# Patient Record
Sex: Female | Born: 1960 | Race: Asian | Hispanic: No | Marital: Married | Smoking: Former smoker
Health system: Southern US, Community
[De-identification: ages and names within clinical notes are randomized; demographics above are authoritative.]

## PROBLEM LIST (undated history)

## (undated) ENCOUNTER — Emergency Department (HOSPITAL_COMMUNITY)

## (undated) DIAGNOSIS — M7918 Myalgia, other site: Secondary | ICD-10-CM

## (undated) DIAGNOSIS — T8484XA Pain due to internal orthopedic prosthetic devices, implants and grafts, initial encounter: Secondary | ICD-10-CM

## (undated) DIAGNOSIS — R519 Headache, unspecified: Secondary | ICD-10-CM

## (undated) DIAGNOSIS — D649 Anemia, unspecified: Secondary | ICD-10-CM

## (undated) DIAGNOSIS — K219 Gastro-esophageal reflux disease without esophagitis: Secondary | ICD-10-CM

## (undated) DIAGNOSIS — R51 Headache: Secondary | ICD-10-CM

## (undated) DIAGNOSIS — M1990 Unspecified osteoarthritis, unspecified site: Secondary | ICD-10-CM

## (undated) HISTORY — DX: Gastro-esophageal reflux disease without esophagitis: K21.9

## (undated) HISTORY — DX: Unspecified osteoarthritis, unspecified site: M19.90

## (undated) HISTORY — DX: Headache, unspecified: R51.9

## (undated) HISTORY — DX: Anemia, unspecified: D64.9

## (undated) HISTORY — DX: Myalgia, other site: M79.18

## (undated) HISTORY — DX: Headache: R51

---

## 2007-04-09 HISTORY — PX: ABDOMINAL HYSTERECTOMY: SHX81

## 2012-07-29 ENCOUNTER — Encounter (HOSPITAL_COMMUNITY): Payer: Self-pay

## 2012-07-29 ENCOUNTER — Emergency Department (INDEPENDENT_AMBULATORY_CARE_PROVIDER_SITE_OTHER)
Admission: EM | Admit: 2012-07-29 | Discharge: 2012-07-29 | Disposition: A | Discharge status: Home / Self Care | Payer: BC Managed Care – PPO | Source: Home / Self Care | Attending: Family Medicine | Admitting: Family Medicine

## 2012-07-29 ENCOUNTER — Emergency Department (INDEPENDENT_AMBULATORY_CARE_PROVIDER_SITE_OTHER): Payer: BC Managed Care – PPO

## 2012-07-29 DIAGNOSIS — R1011 Right upper quadrant pain: Secondary | ICD-10-CM

## 2012-07-29 DIAGNOSIS — M545 Low back pain, unspecified: Secondary | ICD-10-CM

## 2012-07-29 LAB — CBC WITH DIFFERENTIAL/PLATELET
Basophils Relative: 1 % (ref 0–1)
Eosinophils Absolute: 1.8 10*3/uL — ABNORMAL HIGH (ref 0.0–0.7)
Eosinophils Relative: 23 % — ABNORMAL HIGH (ref 0–5)
Hemoglobin: 7.1 g/dL — ABNORMAL LOW (ref 12.0–15.0)
Lymphocytes Relative: 34 % (ref 12–46)
MCH: 15.2 pg — ABNORMAL LOW (ref 26.0–34.0)
MCHC: 28.9 g/dL — ABNORMAL LOW (ref 30.0–36.0)
Monocytes Absolute: 0.4 10*3/uL (ref 0.1–1.0)
Neutrophils Relative %: 37 % — ABNORMAL LOW (ref 43–77)
Platelets: 310 10*3/uL (ref 150–400)
RBC: 4.68 MIL/uL (ref 3.87–5.11)

## 2012-07-29 LAB — COMPREHENSIVE METABOLIC PANEL
AST: 27 U/L (ref 0–37)
CO2: 22 mEq/L (ref 19–32)
Calcium: 9.6 mg/dL (ref 8.4–10.5)
Chloride: 104 mEq/L (ref 96–112)
Creatinine, Ser: 0.62 mg/dL (ref 0.50–1.10)
GFR calc Af Amer: >90 mL/min (ref >90)
GFR calc non Af Amer: >90 mL/min (ref >90)
Glucose, Bld: 85 mg/dL (ref 70–99)
Total Bilirubin: 0.3 mg/dL (ref 0.3–1.2)

## 2012-07-29 LAB — POCT URINALYSIS DIP (DEVICE)
Bilirubin Urine: NEGATIVE
Glucose, UA: NEGATIVE mg/dL
Nitrite: NEGATIVE
Urobilinogen, UA: 0.2 mg/dL (ref 0.0–1.0)

## 2012-07-29 NOTE — ED Notes (Addendum)
Reported right upper abdominal and right flank pain for 2 yrs; does not speak [place], translator phone at bedside for MD use

## 2012-07-29 NOTE — ED Notes (Signed)
Please call Dillard Essex, emergency contact, to relay lab issues to patient (cell #[phone]; )
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
## 2012-07-30 NOTE — ED Provider Notes (Signed)
Medical screening examination/treatment/procedure(s) were performed by non-physician practitioner and as supervising physician I was immediately available for consultation/collaboration.   [facility]; MD  Sharin Grave, MD 07/30/12 [phone]

## 2012-08-01 ENCOUNTER — Telehealth (HOSPITAL_COMMUNITY): Payer: Self-pay | Admitting: *Deleted

## 2012-08-01 NOTE — ED Notes (Signed)
Linn Crowell called on VM and said no one had called her the lab results. I called her back.  Pt. verified x 2 and given results.  Hgb is 7.1 but she does not think pt. Is symptomatic with it. This may be a long standing anemia.  I told her if pt. Is dizzy, lightheaded or feels like she is going to pass out, take her to the ED.  I told her I would talk to a provider tomorrow and call back.  She asked if she can get the pt. some iron pills to take. I told her that was OK. She is also going to call [place] office on Monday and schedule an appointment. Vassie Moselle 08/01/2012

## 2012-08-02 ENCOUNTER — Telehealth (HOSPITAL_COMMUNITY): Payer: Self-pay | Admitting: *Deleted

## 2012-08-02 NOTE — ED Notes (Signed)
I asked Dr. Lorenz Coaster to review pt.'s labs.  He thinks it is a long term situation possibly due to iron deficiency.  I told him of my conversation with Dillard Essex the night before and she said pt. has not expressed any c/o dizziness, lightheadedness or near syncope, that would indicate and acute blood loss.  I told him she is going to call the  office in AM, to schedule a f/u appt. for the pt.  I told him, that I had suggested she start an iron supplement until she can get into the [place] office.  He agreed that pt. should take Iron TID with meals, pt. should be rechecked in 1 week here, if not able to get an appointment this week.  I called Mrs. Crowell back and gave her this information.  She asked if it should be 1 week from today or 1 week from Wednesday. I told her Wednesday, to make sure Hgb is not going down.  I suggested she let the office know about pt.'s anemia, so they will work her in sooner.  She said she saw the pt. today at church and she said she was OK but still had the abdominal pain.  I told her to take the pt. to ED if worsening abdominal pain, heavy vaginal bleeding, vomiting blood or passing blood in the stool ( warned that iron would make the stool black.)  She voiced understanding. Vassie Moselle 08/02/2012

## 2012-08-07 ENCOUNTER — Encounter (HOSPITAL_COMMUNITY): Payer: Self-pay

## 2012-08-07 ENCOUNTER — Emergency Department (INDEPENDENT_AMBULATORY_CARE_PROVIDER_SITE_OTHER)
Admission: EM | Admit: 2012-08-07 | Discharge: 2012-08-07 | Disposition: A | Discharge status: Home / Self Care | Payer: BC Managed Care – PPO | Source: Home / Self Care | Attending: Family Medicine | Admitting: Family Medicine

## 2012-08-07 DIAGNOSIS — D649 Anemia, unspecified: Secondary | ICD-10-CM

## 2012-08-07 LAB — CBC WITH DIFFERENTIAL/PLATELET
Basophils Absolute: 0.1 10*3/uL (ref 0.0–0.1)
HCT: 24.7 % — ABNORMAL LOW (ref 36.0–46.0)
Lymphs Abs: 1.9 10*3/uL (ref 0.7–4.0)
MCH: 15.4 pg — ABNORMAL LOW (ref 26.0–34.0)
MCHC: 28.3 g/dL — ABNORMAL LOW (ref 30.0–36.0)
MCV: 54.2 fL — ABNORMAL LOW (ref 78.0–100.0)
Monocytes Absolute: 0.3 10*3/uL (ref 0.1–1.0)
Monocytes Relative: 3 % (ref 3–12)
Neutro Abs: 5.2 10*3/uL (ref 1.7–7.7)
RDW: 22.4 % — ABNORMAL HIGH (ref 11.5–15.5)
WBC: 8.7 10*3/uL (ref 4.0–10.5)

## 2012-08-07 LAB — FERRITIN: Ferritin: 19 ng/mL (ref 10–291)

## 2012-08-07 LAB — IRON AND TIBC: Iron: 29 ug/dL — ABNORMAL LOW (ref 42–135)

## 2012-08-07 LAB — OCCULT BLOOD, POC DEVICE: Fecal Occult Bld: NEGATIVE

## 2012-08-07 LAB — RETICULOCYTES
RBC.: 4.56 MIL/uL (ref 3.87–5.11)
Retic Count, Absolute: 141.4 10*3/uL (ref 19.0–186.0)
Retic Ct Pct: 3.1 % (ref 0.4–3.1)

## 2012-08-07 MED ORDER — FOLIC ACID 1 MG PO TABS: 1.0000 mg | ORAL_TABLET | Freq: Every day | ORAL | Status: DC

## 2012-08-07 NOTE — ED Notes (Signed)
Lab results will be reviewed w pt interpreter when they are available. Best contacts are listed in recoed; patient does not speak English and has an app to see MD at [facility] later this month

## 2012-08-07 NOTE — ED Provider Notes (Signed)
History     CSN: [id number]  Arrival date & time 08/07/12  1011   First MD Initiated Contact with Patient 08/07/12 1034      Chief Complaint  Patient presents with  . Anemia    (Consider location/radiation/quality/duration/timing/severity/associated sxs/prior treatment) HPI Comments: 52 year old female with not known past medical history. Here with a friend for a anemia followup. Patient was seen here on April 24 complaining of chronic right upper quadrant pain. In her workup it was determined that patient was anemic with a hemoglobin of 7.1 and low hematocrit as well. She was instructed to take iron 3 times a day and was asked to return here for recheck of her hemoglobin in 1 week. Patient speaks a dialect from a [place] Our telephone interpreter line does not have an available interpreter for her language today. As per patient companion who has talked to patient's son (son speaks [place] and [place]) patient does not have any complaints or symptoms other than the intermittent RUQ discomfort, no vomiting, she has been taking iron pills for the last few days. She has not been sick in the last few days either. Thorough signs my impression is that patient denies pain or dizziness. No other history has been able to be obtained.   History reviewed. No pertinent past medical history.  History reviewed. No pertinent past surgical history.  No family history on file.  History  Substance Use Topics  . Smoking status: Not on file  . Smokeless tobacco: Not on file  . Alcohol Use: Not on file    OB History   Grav Para Term Preterm Abortions TAB SAB Ect Mult Living                  Review of Systems  Constitutional: Negative for fever and appetite change.  Cardiovascular: Negative for chest pain.  Gastrointestinal: Negative for nausea, vomiting and diarrhea.  Neurological: Negative for dizziness and headaches.  All other systems reviewed and are negative.    Allergies   Review of patient's allergies indicates no known allergies.  Home Medications   Current Outpatient Rx  Name  Route  Sig  Dispense  Refill  . docusate sodium (COLACE) 100 MG capsule   Oral   Take 100 mg by mouth 2 (two) times daily.         . folic acid (FOLVITE) 1 MG tablet   Oral   Take 1 tablet (1 mg total) by mouth daily.   30 tablet   2     BP 116/73  Pulse 71  Temp(Src) 98.1 F (36.7 C) (Oral)  SpO2 96%  Physical Exam  Nursing note and vitals reviewed. Constitutional: She is oriented to person, place, and time. She appears well-developed and well-nourished. No distress.  Cooperative, smiles during exam.  HENT:  Head: Normocephalic and atraumatic.  Mouth/Throat: Oropharynx is clear and moist. No oropharyngeal exudate.  No sublingual jaundice  Eyes: Conjunctivae and EOM are normal. Pupils are equal, round, and reactive to light. No scleral icterus.  Neck: Neck supple. No thyromegaly present.  Cardiovascular: Normal rate, regular rhythm and normal heart sounds.   Pulmonary/Chest: Effort normal and breath sounds normal. No respiratory distress. She has no wheezes. She has no rales. She exhibits no tenderness.  Abdominal: Soft. Bowel sounds are normal. She exhibits no distension and no mass. There is no tenderness. There is no rebound and no guarding.  No palpable hepatosplenomegaly.  Genitourinary: Guaiac negative stool.  Neurological: She is  alert and oriented to person, place, and time.  Skin: No rash noted. She is not diaphoretic.  No bruising, echymosis or purpura.    ED Course  Procedures (including critical care time)  Labs Reviewed  CBC  FERRITIN  IRON AND TIBC  HEPATITIS B SURFACE ANTIGEN  TSH  RETICULOCYTES  OCCULT BLOOD, POC DEVICE   No results found.   1. Anemia       MDM  52 year old female from [place] here with an incidental find of low hemoglobin and hematocrit. Likely chronic anemia. Guaiac test is negative today. No jaundice  on examination and no hepato- or splenomegaly. Recent CBC shows target cells and elliptocytes; iron deficiency anemia and hemolytic anemias ( hereditary elliptocytosis, south Asian ovalocytosis?) without acute hemolysis in the differential as patient had normal bilirubin. I recheck CBC today, order iron studies and reticulocyte counts. Hemoglobin electrophoresis.I also checked TSH. Patient appears to be no symptomatic, her physical exam and vital signs are normal. She has a comming appointment next month at the [facility] outpatient clinic. I prescribed folic acid in addition to current iron supplementation. Supportive care and red flags that should prompt her return to medical attention discussed with her companion and provided in writing.  Note: Hemoglobin/Hct today 7.0/24.7 stable compared with 7.1/24.6 1 week ago.          Sharin Grave, MD 08/07/12 1511

## 2012-08-07 NOTE — ED Notes (Signed)
Does not speak [place] ; here for lab recheck, poss anemia

## 2012-08-07 NOTE — ED Notes (Addendum)
Here for repeat lab work - Hgb 7.1.  Needs iron level - here 1 week ago - referred to [facility] - unable to9 get appointment until June. Taking OTC iron tablets tid - ? dose

## 2012-08-11 LAB — HEMOGLOBINOPATHY EVALUATION
Hgb A2 Quant: 1.9 % — ABNORMAL LOW (ref 2.2–3.2)
Hgb F Quant: 0 % (ref 0.0–2.0)
Hgb S Quant: 0 %

## 2012-09-14 ENCOUNTER — Encounter: Payer: Self-pay | Admitting: Internal Medicine

## 2012-09-14 ENCOUNTER — Other Ambulatory Visit (INDEPENDENT_AMBULATORY_CARE_PROVIDER_SITE_OTHER): Payer: BC Managed Care – PPO

## 2012-09-14 ENCOUNTER — Ambulatory Visit (INDEPENDENT_AMBULATORY_CARE_PROVIDER_SITE_OTHER): Payer: BC Managed Care – PPO | Admitting: Internal Medicine

## 2012-09-14 VITALS — BP 92/64 | HR 76 | Temp 97.4°F | Ht <= 58 in | Wt 111.0 lb

## 2012-09-14 DIAGNOSIS — Z131 Encounter for screening for diabetes mellitus: Secondary | ICD-10-CM

## 2012-09-14 DIAGNOSIS — Z1329 Encounter for screening for other suspected endocrine disorder: Secondary | ICD-10-CM

## 2012-09-14 DIAGNOSIS — Z13220 Encounter for screening for lipoid disorders: Secondary | ICD-10-CM

## 2012-09-14 DIAGNOSIS — M5430 Sciatica, unspecified side: Secondary | ICD-10-CM

## 2012-09-14 DIAGNOSIS — Z0000 Encounter for general adult medical examination without abnormal findings: Secondary | ICD-10-CM

## 2012-09-14 DIAGNOSIS — D509 Iron deficiency anemia, unspecified: Secondary | ICD-10-CM

## 2012-09-14 DIAGNOSIS — M5431 Sciatica, right side: Secondary | ICD-10-CM

## 2012-09-14 LAB — CBC
MCHC: 29.6 g/dL — ABNORMAL LOW (ref 30.0–36.0)
MCV: 65.5 fl — ABNORMAL LOW (ref 78.0–100.0)
Platelets: 295 10*3/uL (ref 150.0–400.0)
RDW: 36.2 % — ABNORMAL HIGH (ref 11.5–14.6)

## 2012-09-14 LAB — BASIC METABOLIC PANEL
BUN: 16 mg/dL (ref 6–23)
Creatinine, Ser: 0.7 mg/dL (ref 0.4–1.2)
GFR: 90.61 mL/min (ref >60.00)
Potassium: 3.6 mEq/L (ref 3.5–5.1)

## 2012-09-14 LAB — LIPID PANEL
Cholesterol: 168 mg/dL (ref 0–200)
Triglycerides: 301 mg/dL — ABNORMAL HIGH (ref 0.0–149.0)
VLDL: 60.2 mg/dL — ABNORMAL HIGH (ref 0.0–40.0)

## 2012-09-14 MED ORDER — PREDNISONE 20 MG PO TABS: ORAL_TABLET | ORAL | Status: DC

## 2012-09-14 NOTE — Progress Notes (Signed)
HPI  Pt presents to the clinic to establish care. She has not had any medical care since moving here from [place] 5 years ago. She does have some concerns today about pain in her back that radiates down her right leg. This has been ongoing for the last year. Nothing makes it worse. Laying down makes it better. She does not take anything for the pain. She denies having any injury to the back that she is aware. She denies loss of bowel or bladder.  Flu: unknown Tetanus: 2009 Eye doctor: never Dentist: never Colonoscopy: never Mammogram: never   Past Medical History  Diagnosis Date  . Anemia   . Arthritis     Current Outpatient Prescriptions  Medication Sig Dispense Refill  . docusate sodium (COLACE) 100 MG capsule Take 100 mg by mouth daily.       . folic acid (FOLVITE) 800 MCG tablet Take 800 mcg by mouth daily.      Marland Kitchen aspirin 325 MG tablet Take 325 mg by mouth every 6 (six) hours as needed for pain.      . Ferrous Sulfate (IRON) 325 (65 FE) MG TABS Take 1 tablet by mouth daily.       No current facility-administered medications for this visit.    No Known Allergies  Family History  Problem Relation Age of Onset  . Family history unknown: Yes    History   Social History  . Marital Status: Married    Spouse Name: N/A    Number of Children: N/A  . Years of Education: N/A   Occupational History  . Unemployed    Social History Main Topics  . Smoking status: Current Every Day Smoker -- 0.25 packs/day for 30 years    Types: Cigarettes  . Smokeless tobacco: Never Used  . Alcohol Use: No  . Drug Use: No  . Sexually Active: Not Currently   Other Topics Concern  . Not on file   Social History Narrative   Regular exercise-no   Caffeine Use-no    ROS:  Constitutional: Denies fever, malaise, fatigue, headache or abrupt weight changes.  HEENT: Denies eye pain, eye redness, ear pain, ringing in the ears, wax buildup, runny nose, nasal congestion, bloody nose, or sore  throat. Respiratory: Denies difficulty breathing, shortness of breath, cough or sputum production.   Cardiovascular: Denies chest pain, chest tightness, palpitations or swelling in the hands or feet.  Gastrointestinal: Denies abdominal pain, bloating, constipation, diarrhea or blood in the stool.  GU: Denies frequency, urgency, pain with urination, blood in urine, odor or discharge. Musculoskeletal: Pt reports back pain. Denies decrease in range of motion, difficulty with gait, or joint pain and swelling.  Skin: Denies redness, rashes, lesions or ulcercations.  Neurological: Pt reports sharp shooting pain down right leg. Denies dizziness, difficulty with memory, difficulty with speech or problems with balance and coordination.   No other specific complaints in a complete review of systems (except as listed in HPI above).  PE:  BP 92/64  Pulse 76  Temp(Src) 97.4 F (36.3 C) (Oral)  Ht 4\' 9"  (1.448 m)  Wt 111 lb (50.349 kg)  BMI 24.01 kg/m2  SpO2 98% Wt Readings from Last 3 Encounters:  09/14/12 111 lb (50.349 kg)    General: Appears her stated age, well developed, well nourished in NAD. Non english speaking HEENT: Head: normal shape and size; Eyes: sclera white, no icterus, conjunctiva pink, PERRLA and EOMs intact; Ears: Tm's gray and intact, normal light reflex; Nose: mucosa  pink and moist, septum midline; Throat/Mouth: Teeth present, mucosa pink and moist, no lesions or ulcerations noted.  Neck: Normal range of motion. Neck supple, trachea midline. No massses, lumps or thyromegaly present.  Cardiovascular: Normal rate and rhythm. S1,S2 noted.  No murmur, rubs or gallops noted. No JVD or BLE edema. No carotid bruits noted. Pulmonary/Chest: Normal effort and positive vesicular breath sounds. No respiratory distress. No wheezes, rales or ronchi noted.  Abdomen: Soft and nontender. Normal bowel sounds, no bruits noted. No distention or masses noted. Liver, spleen and kidneys non  palpable. Musculoskeletal: Normal range of motion. No signs of joint swelling. No difficulty with gait.  Neurological: Alert and oriented. Cranial nerves II-XII intact. Coordination normal. +DTRs bilaterally. Positive straight leg raise. Psychiatric: Mood and affect normal. Behavior is normal. Judgment and thought content normal.      Assessment and Plan:    Preventative Health Maintenance:  Will set up mammogram today Call insurance company and see how much of colonoscopy is covered Will obtain labs today  Sciatica Neuralgia right side, new onset:  eRx for pred taper Stretching exercises as indicated on handout Then take aleve daily for pain

## 2012-09-14 NOTE — Patient Instructions (Signed)

## 2012-09-16 ENCOUNTER — Telehealth: Payer: Self-pay

## 2012-09-16 NOTE — Telephone Encounter (Signed)
Left message for pt's sponsor to callback office.

## 2012-09-16 NOTE — Telephone Encounter (Signed)
Pt's sponsor informed of results and NP's advisement. Copy of labs also mailed to pt's sponsor.

## 2012-09-16 NOTE — Telephone Encounter (Signed)
Rebecca Knapp, a sponsor of patient states that sheis returning a call to back to [place] regarding results. No phone number provided. Thanks

## 2013-03-15 ENCOUNTER — Ambulatory Visit (INDEPENDENT_AMBULATORY_CARE_PROVIDER_SITE_OTHER): Payer: Self-pay | Admitting: Family Medicine

## 2013-03-15 ENCOUNTER — Encounter: Payer: BC Managed Care – PPO | Admitting: *Deleted

## 2013-03-15 VITALS — BP 112/76 | HR 90 | Temp 97.6°F | Ht <= 58 in | Wt 118.0 lb

## 2013-03-15 DIAGNOSIS — E041 Nontoxic single thyroid nodule: Secondary | ICD-10-CM

## 2013-03-15 DIAGNOSIS — M25569 Pain in unspecified knee: Secondary | ICD-10-CM | POA: Insufficient documentation

## 2013-03-15 DIAGNOSIS — D509 Iron deficiency anemia, unspecified: Secondary | ICD-10-CM

## 2013-03-15 NOTE — Assessment & Plan Note (Addendum)
asymtpomatic and w/o insurance at this time Monitor for now.

## 2013-03-15 NOTE — Patient Instructions (Signed)
THank you for coming in today Your knee pain is likely from arthritis from many years of use Please try tylenol 1000mg  every 4 hours as needed for knee pain Please perform your exercises 2 times a day Please come back in 4 weeks   Knee Exercises EXERCISES RANGE OF MOTION(ROM) AND STRETCHING EXERCISES These exercises may help you when beginning to rehabilitate your injury. Your symptoms may resolve with or without further involvement from your physician, physical therapist or athletic trainer. While completing these exercises, remember:   Restoring tissue flexibility helps normal motion to return to the joints. This allows healthier, less painful movement and activity.  An effective stretch should be held for at least 30 seconds.  A stretch should never be painful. You should only feel a gentle lengthening or release in the stretched tissue. STRETCH - Knee Extension, Prone  Lie on your stomach on a firm surface, such as a bed or countertop. Place your right / left knee and leg just beyond the edge of the surface. You may wish to place a towel under the far end of your right / left thigh for comfort.  Relax your leg muscles and allow gravity to straighten your knee. Your clinician may advise you to add an ankle weight if more resistance is helpful for you.  You should feel a stretch in the back of your right / left knee. Hold this position for __________ seconds. Repeat __________ times. Complete this stretch __________ times per day. * Your physician, physical therapist or athletic trainer may ask you to add ankle weight to enhance your stretch.  RANGE OF MOTION - Knee Flexion, Active  Lie on your back with both knees straight. (If this causes back discomfort, bend your opposite knee, placing your foot flat on the floor.)  Slowly slide your heel back toward your buttocks until you feel a gentle stretch in the front of your knee or thigh.  Hold for __________ seconds. Slowly slide your  heel back to the starting position. Repeat __________ times. Complete this exercise __________ times per day.  STRETCH - Quadriceps, Prone   Lie on your stomach on a firm surface, such as a bed or padded floor.  Bend your right / left knee and grasp your ankle. If you are unable to reach, your ankle or pant leg, use a belt around your foot to lengthen your reach.  Gently pull your heel toward your buttocks. Your knee should not slide out to the side. You should feel a stretch in the front of your thigh and/or knee.  Hold this position for __________ seconds. Repeat __________ times. Complete this stretch __________ times per day.  STRETCH  Hamstrings, Supine   Lie on your back. Loop a belt or towel over the ball of your right / left foot.  Straighten your right / left knee and slowly pull on the belt to raise your leg. Do not allow the right / left knee to bend. Keep your opposite leg flat on the floor.  Raise the leg until you feel a gentle stretch behind your right / left knee or thigh. Hold this position for __________ seconds. Repeat __________ times. Complete this stretch __________ times per day.  STRENGTHENING EXERCISES These exercises may help you when beginning to rehabilitate your injury. They may resolve your symptoms with or without further involvement from your physician, physical therapist or athletic trainer. While completing these exercises, remember:   Muscles can gain both the endurance and the strength needed for  everyday activities through controlled exercises.  Complete these exercises as instructed by your physician, physical therapist or athletic trainer. Progress the resistance and repetitions only as guided.  You may experience muscle soreness or fatigue, but the pain or discomfort you are trying to eliminate should never worsen during these exercises. If this pain does worsen, stop and make certain you are following the directions exactly. If the pain is still  present after adjustments, discontinue the exercise until you can discuss the trouble with your clinician. STRENGTH - Quadriceps, Isometrics  Lie on your back with your right / left leg extended and your opposite knee bent.  Gradually tense the muscles in the front of your right / left thigh. You should see either your knee cap slide up toward your hip or increased dimpling just above the knee. This motion will push the back of the knee down toward the floor/mat/bed on which you are lying.  Hold the muscle as tight as you can without increasing your pain for __________ seconds.  Relax the muscles slowly and completely in between each repetition. Repeat __________ times. Complete this exercise __________ times per day.  STRENGTH - Quadriceps, Short Arcs   Lie on your back. Place a __________ inch towel roll under your knee so that the knee slightly bends.  Raise only your lower leg by tightening the muscles in the front of your thigh. Do not allow your thigh to rise.  Hold this position for __________ seconds. Repeat __________ times. Complete this exercise __________ times per day.  OPTIONAL ANKLE WEIGHTS: Begin with ____________________, but DO NOT exceed ____________________. Increase in 1 pound/0.5 kilogram increments.  STRENGTH - Quadriceps, Straight Leg Raises  Quality counts! Watch for signs that the quadriceps muscle is working to insure you are strengthening the correct muscles and not "cheating" by substituting with healthier muscles.  Lay on your back with your right / left leg extended and your opposite knee bent.  Tense the muscles in the front of your right / left thigh. You should see either your knee cap slide up or increased dimpling just above the knee. Your thigh may even quiver.  Tighten these muscles even more and raise your leg 4 to 6 inches off the floor. Hold for __________ seconds.  Keeping these muscles tense, lower your leg.  Relax the muscles slowly and  completely in between each repetition. Repeat __________ times. Complete this exercise __________ times per day.  STRENGTH - Hamstring, Curls  Lay on your stomach with your legs extended. (If you lay on a bed, your feet may hang over the edge.)  Tighten the muscles in the back of your thigh to bend your right / left knee up to 90 degrees. Keep your hips flat on the bed/floor.  Hold this position for __________ seconds.  Slowly lower your leg back to the starting position. Repeat __________ times. Complete this exercise __________ times per day.  OPTIONAL ANKLE WEIGHTS: Begin with ____________________, but DO NOT exceed ____________________. Increase in 1 pound/0.5 kilogram increments.  STRENGTH  Quadriceps, Squats  Stand in a door frame so that your feet and knees are in line with the frame.  Use your hands for balance, not support, on the frame.  Slowly lower your weight, bending at the hips and knees. Keep your lower legs upright so that they are parallel with the door frame. Squat only within the range that does not increase your knee pain. Never let your hips drop below your knees.  Slowly return  upright, pushing with your legs, not pulling with your hands. Repeat __________ times. Complete this exercise __________ times per day.  STRENGTH - Quadriceps, Wall Slides  Follow guidelines for form closely. Increased knee pain often results from poorly placed feet or knees.  Lean against a smooth wall or door and walk your feet out 18-24 inches. Place your feet hip-width apart.  Slowly slide down the wall or door until your knees bend __________ degrees.* Keep your knees over your heels, not your toes, and in line with your hips, not falling to either side.  Hold for __________ seconds. Stand up to rest for __________ seconds in between each repetition. Repeat __________ times. Complete this exercise __________ times per day. * Your physician, physical therapist or athletic trainer will  alter this angle based on your symptoms and progress. Document Released: 02/06/2005 Document Revised: 06/17/2011 Document Reviewed: 07/07/2008 [facility] Patient Information 2014 [place], [place].

## 2013-03-15 NOTE — Progress Notes (Signed)
Rebecca Knapp is a 52 y.o. female who presents to [facility] today for knee pain and for initiation of care to [facility]  Patient seen with friend from church/community as well as interpreter.   Knee pain: bilat. Wakes up at night. ASA 325 w/ some relief. Ongoing for several months. Worse in the am. Worse w/ sedentary lifestyle. Better w/ ambulation. Denies falls or other joint involvment.    The following portions of the patient's history were reviewed and updated as appropriate: allergies, current medications, past medical history, family and social history, and problem list.  Patient is a nonsmoker.   Past Medical History  Diagnosis Date  . Headache   . Musculoskeletal pain     ROS as above otherwise neg.    Medications reviewed. Current Outpatient Prescriptions  Medication Sig Dispense Refill  . acetaminophen (TYLENOL) 650 MG CR tablet Take 650 mg by mouth every 8 (eight) hours as needed for pain.      Marland Kitchen aspirin 325 MG tablet Take 325 mg by mouth daily as needed.       No current facility-administered medications for this visit.    Exam: There were no vitals taken for this visit. Gen: Well NAD HEENT: EOMI,  MMM, slight enlargement of the thyroid on the R.  Lungs: CTABL Nl WOB Heart: RRR no MRG Abd: NABS, NT, ND Exts: Non edematous BL  LE, warm and well perfused.  MSK: FROM Lower extremities. Crepitus of R knee. No pain on valgus/varus stresses or lochmans. Mild effusion of R knee.   No results found for this or any previous visit (from the past 72 hour(s)).  A/P (as seen in Problem list)  Thyroid nodule asymtpomatic and w/o insurance at this time Monitor for now.   Knee pain Likely OA vs RA vs gout vs other Tylenol adn exercises for now NSAIDs deferred due to unknow renal fxn F/u in 4 wks.  Consider R knee tap if pain/fluid persists

## 2013-03-15 NOTE — Assessment & Plan Note (Signed)
Likely OA vs RA vs gout vs other Tylenol adn exercises for now NSAIDs deferred due to unknow renal fxn F/u in 4 wks.  Consider R knee tap if pain/fluid persists

## 2013-03-15 NOTE — Progress Notes (Signed)
Patient ID: Rebecca Knapp, female   DOB: 02-Feb-1961, 52 y.o.   MRN: [id number] ENTERED IN ERROR. WRONG PATIENT This encounter was created in error - please disregard.

## 2013-03-22 ENCOUNTER — Encounter: Payer: Self-pay | Admitting: Family Medicine

## 2013-04-12 ENCOUNTER — Ambulatory Visit (INDEPENDENT_AMBULATORY_CARE_PROVIDER_SITE_OTHER): Payer: Self-pay | Admitting: Family Medicine

## 2013-04-12 ENCOUNTER — Encounter: Payer: Self-pay | Admitting: Family Medicine

## 2013-04-12 VITALS — BP 106/67 | HR 89 | Temp 98.1°F | Ht <= 58 in | Wt 120.1 lb

## 2013-04-12 DIAGNOSIS — M25569 Pain in unspecified knee: Secondary | ICD-10-CM

## 2013-04-12 NOTE — Patient Instructions (Signed)
Thank you for coming in today  Your knee pain is likely from arthritis Please try ibuprofen 400mg  every 4-6 hours Stop taking aspirin. Please try increasing your tylenol to 975mg -1000mg  every 6-8 hours You may also try a neoprene sleeve on days that you walk Please follow up in the [facility] with Dr. Gwendolyn GrantWalden after you obtain either the orange card or medicaid so we can perform labs and steroid injections in the knees

## 2013-04-12 NOTE — Assessment & Plan Note (Addendum)
Persistent pain despite exercises and tylenol 975mg  BID Likely OA.  Would like to obtain Xray but w/o insurance and would not want to burden pt w/ cost No basic labs due to insurance and refuge status Pt trying to obtain medicaid or the orange card Recommended to pt to go to health Dept for labs Ibuprofen 400mg  Q4-6hrs PRN Stop aspirin Pt may try neoprene sleeve for relief Knee tapped unseccessfully today  Return when able after obtaining insurance or orange card for labs and steroid injection

## 2013-04-12 NOTE — Progress Notes (Signed)
Rebecca Knapp is a 53 y.o. female who presents to [facility] today for immigrant clinic for f/u for Knee pain  Knee pain: performing exercises BID w/o much benefit. Tylenol (975mg ) w/o benefit. Walking about 20min daily. Pain worse at night if has walked significantly during the day. Bilat. Worsening. Occasionally uses needle to alleviate pain w/o benefit.   The following portions of the patient's history were reviewed and updated as appropriate: allergies, current medications, past medical history, family and social history, and problem list.  Patient is a nonsmoker.    Past Medical History  Diagnosis Date  . Headache   . Musculoskeletal pain   . Osteoarthritis   . GERD (gastroesophageal reflux disease)     ROS as above otherwise neg.    Medications reviewed. Current Outpatient Prescriptions  Medication Sig Dispense Refill  . acetaminophen (TYLENOL) 650 MG CR tablet Take 650 mg by mouth every 8 (eight) hours as needed for pain.      Marland Kitchen. aspirin 325 MG tablet Take 325 mg by mouth daily as needed.       No current facility-administered medications for this visit.    Exam:  BP 106/67  Pulse 89  Temp(Src) 98.1 F (36.7 C)  Ht 4' 9.5" (1.461 m)  Wt 120 lb 1.6 oz (54.477 kg)  BMI 25.52 kg/m2 Gen: Well NAD HEENT: EOMI,  MMM Lungs: CTABL Nl WOB Heart: RRR no MRG Abd: NABS, NT, ND Exts: Non edematous BL  LE, warm and well perfused.  MSK: bilat knees w/ FROM. Crepitus bilat. Effusions bilat. Non tt-p Lochman's nml, valgus and varrus stresses w/o pain.   No results found for this or any previous visit (from the past 72 hour(s)).  A/P (as seen in Problem list)  Knee pain Persistent pain despite exercises and tylenol 975mg  BID Likely OA.  Would like to obtain Xray but w/o insurance and would not want to burden pt w/ cost No basic labs due to insurance and refuge status Pt trying to obtain medicaid or the orange card Recommended to pt to go to health Dept for labs Ibuprofen 400mg  Q4-6hrs  PRN Stop aspirin Pt may try neoprene sleeve for relief Knee tapped unseccessfully today  Return when able after obtaining insurance or orange card for labs and steroid injection   After consent was obtained, using sterile technique the R knee was prepped and 2 ml's of 2% plain Lidocaine used to anesthetize the needle tract into the joint from the lateral suprapatellar and lateral infrapatellar approach. The knee joint was entered and 180ml's of colored fluid was withdrawn.   The procedure was well tolerated.  Watch for fever, or increased swelling or persistent pain in knee. Call or return to clinic prn if such symptoms occur or the knee fails to improve as anticipated.  Shelly Flattenavid Merrell, MD Family Medicine PGY-3 04/12/2013, 4:27 PM

## 2014-01-23 ENCOUNTER — Inpatient Hospital Stay (HOSPITAL_COMMUNITY): Payer: BC Managed Care – PPO

## 2014-01-23 ENCOUNTER — Encounter (HOSPITAL_COMMUNITY): Payer: Self-pay | Admitting: Emergency Medicine

## 2014-01-23 ENCOUNTER — Emergency Department (HOSPITAL_COMMUNITY): Payer: BC Managed Care – PPO

## 2014-01-23 ENCOUNTER — Inpatient Hospital Stay (HOSPITAL_COMMUNITY)
Admission: EM | Admit: 2014-01-23 | Discharge: 2014-01-27 | DRG: 516 | Disposition: A | Discharge status: Home / Self Care | Payer: BC Managed Care – PPO | Attending: General Surgery | Admitting: General Surgery

## 2014-01-23 DIAGNOSIS — Y92410 Unspecified street and highway as the place of occurrence of the external cause: Secondary | ICD-10-CM

## 2014-01-23 DIAGNOSIS — S270XXA Traumatic pneumothorax, initial encounter: Secondary | ICD-10-CM | POA: Diagnosis present

## 2014-01-23 DIAGNOSIS — S301XXA Contusion of abdominal wall, initial encounter: Secondary | ICD-10-CM | POA: Diagnosis present

## 2014-01-23 DIAGNOSIS — S42009A Fracture of unspecified part of unspecified clavicle, initial encounter for closed fracture: Secondary | ICD-10-CM

## 2014-01-23 DIAGNOSIS — S2249XA Multiple fractures of ribs, unspecified side, initial encounter for closed fracture: Secondary | ICD-10-CM | POA: Diagnosis present

## 2014-01-23 DIAGNOSIS — S42021A Displaced fracture of shaft of right clavicle, initial encounter for closed fracture: Secondary | ICD-10-CM | POA: Diagnosis not present

## 2014-01-23 DIAGNOSIS — R339 Retention of urine, unspecified: Secondary | ICD-10-CM | POA: Diagnosis not present

## 2014-01-23 DIAGNOSIS — D62 Acute posthemorrhagic anemia: Secondary | ICD-10-CM | POA: Diagnosis not present

## 2014-01-23 DIAGNOSIS — S2239XA Fracture of one rib, unspecified side, initial encounter for closed fracture: Secondary | ICD-10-CM

## 2014-01-23 DIAGNOSIS — S2243XA Multiple fractures of ribs, bilateral, initial encounter for closed fracture: Secondary | ICD-10-CM | POA: Diagnosis present

## 2014-01-23 DIAGNOSIS — S42001A Fracture of unspecified part of right clavicle, initial encounter for closed fracture: Secondary | ICD-10-CM

## 2014-01-23 DIAGNOSIS — M1990 Unspecified osteoarthritis, unspecified site: Secondary | ICD-10-CM | POA: Diagnosis present

## 2014-01-23 DIAGNOSIS — J939 Pneumothorax, unspecified: Secondary | ICD-10-CM

## 2014-01-23 DIAGNOSIS — K219 Gastro-esophageal reflux disease without esophagitis: Secondary | ICD-10-CM | POA: Diagnosis present

## 2014-01-23 DIAGNOSIS — R338 Other retention of urine: Secondary | ICD-10-CM | POA: Diagnosis not present

## 2014-01-23 LAB — CBC WITH DIFFERENTIAL/PLATELET
BASOS PCT: 0 % (ref 0–1)
Basophils Absolute: 0 10*3/uL (ref 0.0–0.1)
EOS PCT: 10 % — AB (ref 0–5)
Eosinophils Absolute: 1.5 10*3/uL — ABNORMAL HIGH (ref 0.0–0.7)
HEMATOCRIT: 37.1 % (ref 36.0–46.0)
Hemoglobin: 11.6 g/dL — ABNORMAL LOW (ref 12.0–15.0)
Lymphocytes Relative: 20 % (ref 12–46)
Lymphs Abs: 3.2 10*3/uL (ref 0.7–4.0)
MCH: 23.5 pg — ABNORMAL LOW (ref 26.0–34.0)
MCHC: 31.3 g/dL (ref 30.0–36.0)
MCV: 75.1 fL — AB (ref 78.0–100.0)
MONO ABS: 0.5 10*3/uL (ref 0.1–1.0)
Monocytes Relative: 3 % (ref 3–12)
NEUTROS ABS: 10.7 10*3/uL — AB (ref 1.7–7.7)
Neutrophils Relative %: 67 % (ref 43–77)
Platelets: 291 10*3/uL (ref 150–400)
RBC: 4.94 MIL/uL (ref 3.87–5.11)
RDW: 16.1 % — ABNORMAL HIGH (ref 11.5–15.5)
WBC: 15.8 10*3/uL — ABNORMAL HIGH (ref 4.0–10.5)

## 2014-01-23 LAB — I-STAT CHEM 8, ED
BUN: 25 mg/dL — AB (ref 6–23)
CHLORIDE: 109 meq/L (ref 96–112)
Calcium, Ion: 1.21 mmol/L (ref 1.12–1.23)
Creatinine, Ser: 0.7 mg/dL (ref 0.50–1.10)
Glucose, Bld: 133 mg/dL — ABNORMAL HIGH (ref 70–99)
HCT: 42 % (ref 36.0–46.0)
Hemoglobin: 14.3 g/dL (ref 12.0–15.0)
POTASSIUM: 4.6 meq/L (ref 3.7–5.3)
SODIUM: 141 meq/L (ref 137–147)
TCO2: 21 mmol/L (ref 0–100)

## 2014-01-23 LAB — COMPREHENSIVE METABOLIC PANEL
ALBUMIN: 4 g/dL (ref 3.5–5.2)
ALT: 49 U/L — ABNORMAL HIGH (ref 0–35)
AST: 83 U/L — ABNORMAL HIGH (ref 0–37)
Alkaline Phosphatase: 86 U/L (ref 39–117)
Anion gap: 14 (ref 5–15)
BUN: 18 mg/dL (ref 6–23)
CO2: 20 mEq/L (ref 19–32)
CREATININE: 0.67 mg/dL (ref 0.50–1.10)
Calcium: 9.4 mg/dL (ref 8.4–10.5)
Chloride: 103 mEq/L (ref 96–112)
GFR calc Af Amer: >90 mL/min (ref >90)
GFR calc non Af Amer: >90 mL/min (ref >90)
Glucose, Bld: 125 mg/dL — ABNORMAL HIGH (ref 70–99)
Potassium: 5.1 mEq/L (ref 3.7–5.3)
SODIUM: 137 meq/L (ref 137–147)
Total Bilirubin: 0.3 mg/dL (ref 0.3–1.2)
Total Protein: 8.6 g/dL — ABNORMAL HIGH (ref 6.0–8.3)

## 2014-01-23 LAB — URINALYSIS, ROUTINE W REFLEX MICROSCOPIC
Bilirubin Urine: NEGATIVE
GLUCOSE, UA: NEGATIVE mg/dL
Ketones, ur: NEGATIVE mg/dL
LEUKOCYTES UA: NEGATIVE
Nitrite: NEGATIVE
PH: 5 (ref 5.0–8.0)
Protein, ur: NEGATIVE mg/dL
Urobilinogen, UA: 0.2 mg/dL (ref 0.0–1.0)

## 2014-01-23 LAB — URINE MICROSCOPIC-ADD ON

## 2014-01-23 MED ORDER — ONDANSETRON HCL 4 MG/2ML IJ SOLN
4.0000 mg | Freq: Once | INTRAMUSCULAR | Status: AC
  Administered 2014-01-23: 4 mg via INTRAVENOUS
  Filled 2014-01-23: qty 2

## 2014-01-23 MED ORDER — IOHEXOL 300 MG/ML  SOLN
100.0000 mL | Freq: Once | INTRAMUSCULAR | Status: AC | PRN
  Administered 2014-01-23: 100 mL via INTRAVENOUS

## 2014-01-23 MED ORDER — HYDROCODONE-ACETAMINOPHEN 5-325 MG PO TABS
1.0000 | ORAL_TABLET | ORAL | Status: DC | PRN
  Administered 2014-01-24: 2 via ORAL
  Filled 2014-01-23: qty 2
  Filled 2014-01-23: qty 1

## 2014-01-23 MED ORDER — CEFAZOLIN SODIUM-DEXTROSE 2-3 GM-% IV SOLR
2.0000 g | INTRAVENOUS | Status: AC
  Administered 2014-01-24: 2 g via INTRAVENOUS
  Filled 2014-01-23: qty 50

## 2014-01-23 MED ORDER — CHLORHEXIDINE GLUCONATE 4 % EX LIQD
60.0000 mL | Freq: Once | CUTANEOUS | Status: AC
Start: 2014-01-24 — End: 2014-01-24
  Administered 2014-01-24: 4 via TOPICAL
  Filled 2014-01-23: qty 60

## 2014-01-23 MED ORDER — DOCUSATE SODIUM 100 MG PO CAPS
100.0000 mg | ORAL_CAPSULE | Freq: Two times a day (BID) | ORAL | Status: DC
  Administered 2014-01-23 – 2014-01-27 (×7): 100 mg via ORAL
  Filled 2014-01-23 (×9): qty 1

## 2014-01-23 MED ORDER — KCL IN DEXTROSE-NACL 20-5-0.45 MEQ/L-%-% IV SOLN
INTRAVENOUS | Status: DC
  Administered 2014-01-23: 21:00:00 via INTRAVENOUS
  Filled 2014-01-23 (×5): qty 1000

## 2014-01-23 MED ORDER — ONDANSETRON HCL 4 MG PO TABS
4.0000 mg | ORAL_TABLET | Freq: Four times a day (QID) | ORAL | Status: DC | PRN
Start: 2014-01-23 — End: 2014-01-27

## 2014-01-23 MED ORDER — SODIUM CHLORIDE 0.9 % IV BOLUS (SEPSIS)
1000.0000 mL | Freq: Once | INTRAVENOUS | Status: AC
  Administered 2014-01-23: 1000 mL via INTRAVENOUS

## 2014-01-23 MED ORDER — MORPHINE SULFATE 4 MG/ML IJ SOLN
4.0000 mg | Freq: Once | INTRAMUSCULAR | Status: AC
  Administered 2014-01-23: 4 mg via INTRAVENOUS
  Filled 2014-01-23: qty 1

## 2014-01-23 MED ORDER — MORPHINE SULFATE 2 MG/ML IJ SOLN
1.0000 mg | INTRAMUSCULAR | Status: DC | PRN
  Administered 2014-01-23: 2 mg via INTRAVENOUS
  Administered 2014-01-23: 1 mg via INTRAVENOUS
  Administered 2014-01-24 (×5): 2 mg via INTRAVENOUS
  Administered 2014-01-25: 1 mg via INTRAVENOUS
  Administered 2014-01-25: 2 mg via INTRAVENOUS
  Administered 2014-01-25: 1 mg via INTRAVENOUS
  Administered 2014-01-26: 2 mg via INTRAVENOUS
  Filled 2014-01-23 (×11): qty 1

## 2014-01-23 MED ORDER — HYDROMORPHONE HCL 1 MG/ML IJ SOLN
1.0000 mg | Freq: Once | INTRAMUSCULAR | Status: AC
  Administered 2014-01-23: 1 mg via INTRAVENOUS
  Filled 2014-01-23: qty 1

## 2014-01-23 MED ORDER — ONDANSETRON HCL 4 MG/2ML IJ SOLN
4.0000 mg | Freq: Four times a day (QID) | INTRAMUSCULAR | Status: DC | PRN
  Administered 2014-01-24 (×2): 4 mg via INTRAVENOUS
  Filled 2014-01-23 (×2): qty 2

## 2014-01-23 MED ORDER — ENOXAPARIN SODIUM 40 MG/0.4ML ~~LOC~~ SOLN
40.0000 mg | SUBCUTANEOUS | Status: DC
  Administered 2014-01-25 – 2014-01-27 (×3): 40 mg via SUBCUTANEOUS
  Filled 2014-01-23 (×5): qty 0.4

## 2014-01-23 MED ORDER — ACETAMINOPHEN 325 MG PO TABS
650.0000 mg | ORAL_TABLET | ORAL | Status: DC | PRN
  Administered 2014-01-25 – 2014-01-26 (×3): 650 mg via ORAL
  Filled 2014-01-23 (×3): qty 2

## 2014-01-23 NOTE — ED Notes (Signed)
Trauma at bedside to consult.

## 2014-01-23 NOTE — ED Notes (Signed)
Admitting physicians at bedside, floor to call when available to get report, CHARGE, RN aware of delay.

## 2014-01-23 NOTE — ED Notes (Signed)
Attempted to call report

## 2014-01-23 NOTE — ED Notes (Signed)
Pt logrolled off LSB, no tenderness noted to spine. c-collar remains in place. Pt does not speak english, attempted to use interpreter phone for assessment, no interpreter available at present. CHARGE RN and EDP aware. Vital signs stable at present.

## 2014-01-23 NOTE — H&P (Signed)
Seen, agree with above.  Occult PTX.   Repeat CXR in AM. Pain control, incentive spirometry.    Ortho consult for clavicle fracture.
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
## 2014-01-23 NOTE — Consult Note (Signed)
Seen and agree with the above.  Displaced clavicle fracture with associated rib fractures.   Recommend surgical fixation to prevent risk of nonunion/malunion when safe from the standpoint of her lungs.  Surgery was discussed with the patient and her husband through their pastor over the telephone as an interpreter. 
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
## 2014-01-23 NOTE — Progress Notes (Signed)
Attempted to take report. 

## 2014-01-23 NOTE — ED Notes (Signed)
Pt resting quietly at the time. Vital signs stable. No signs of distress noted. 
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
## 2014-01-24 ENCOUNTER — Encounter (HOSPITAL_COMMUNITY): Admission: EM | Disposition: A | Discharge status: Home / Self Care | Payer: Self-pay | Source: Home / Self Care

## 2014-01-24 ENCOUNTER — Encounter (HOSPITAL_COMMUNITY): Payer: BC Managed Care – PPO | Admitting: Anesthesiology

## 2014-01-24 ENCOUNTER — Inpatient Hospital Stay (HOSPITAL_COMMUNITY): Payer: BC Managed Care – PPO

## 2014-01-24 ENCOUNTER — Inpatient Hospital Stay (HOSPITAL_COMMUNITY): Payer: BC Managed Care – PPO | Admitting: Anesthesiology

## 2014-01-24 DIAGNOSIS — S270XXA Traumatic pneumothorax, initial encounter: Secondary | ICD-10-CM | POA: Diagnosis present

## 2014-01-24 DIAGNOSIS — S42001A Fracture of unspecified part of right clavicle, initial encounter for closed fracture: Secondary | ICD-10-CM | POA: Diagnosis present

## 2014-01-24 DIAGNOSIS — R338 Other retention of urine: Secondary | ICD-10-CM | POA: Diagnosis not present

## 2014-01-24 DIAGNOSIS — D62 Acute posthemorrhagic anemia: Secondary | ICD-10-CM | POA: Clinically undetermined

## 2014-01-24 HISTORY — PX: ORIF CLAVICULAR FRACTURE: SHX5055

## 2014-01-24 LAB — CBC
HEMATOCRIT: 30.6 % — AB (ref 36.0–46.0)
Hemoglobin: 9.5 g/dL — ABNORMAL LOW (ref 12.0–15.0)
MCH: 23.1 pg — AB (ref 26.0–34.0)
MCHC: 31 g/dL (ref 30.0–36.0)
MCV: 74.3 fL — AB (ref 78.0–100.0)
PLATELETS: 223 10*3/uL (ref 150–400)
RBC: 4.12 MIL/uL (ref 3.87–5.11)
RDW: 15.8 % — ABNORMAL HIGH (ref 11.5–15.5)
WBC: 8.9 10*3/uL (ref 4.0–10.5)

## 2014-01-24 LAB — BASIC METABOLIC PANEL
ANION GAP: 14 (ref 5–15)
BUN: 10 mg/dL (ref 6–23)
CO2: 19 meq/L (ref 19–32)
CREATININE: 0.6 mg/dL (ref 0.50–1.10)
Calcium: 8.7 mg/dL (ref 8.4–10.5)
Chloride: 106 mEq/L (ref 96–112)
GFR calc Af Amer: >90 mL/min (ref >90)
GFR calc non Af Amer: >90 mL/min (ref >90)
Glucose, Bld: 105 mg/dL — ABNORMAL HIGH (ref 70–99)
Potassium: 3.9 mEq/L (ref 3.7–5.3)
Sodium: 139 mEq/L (ref 137–147)

## 2014-01-24 LAB — MRSA PCR SCREENING: MRSA by PCR: NEGATIVE

## 2014-01-24 SURGERY — OPEN REDUCTION INTERNAL FIXATION (ORIF) CLAVICULAR FRACTURE
Anesthesia: General | Laterality: Right

## 2014-01-24 MED ORDER — BUPIVACAINE HCL 0.25 % IJ SOLN
INTRAMUSCULAR | Status: DC | PRN
  Administered 2014-01-24: 10 mL

## 2014-01-24 MED ORDER — ROCURONIUM BROMIDE 50 MG/5ML IV SOLN
INTRAVENOUS | Status: AC
  Filled 2014-01-24: qty 1

## 2014-01-24 MED ORDER — LACTATED RINGERS IV SOLN
INTRAVENOUS | Status: DC
  Administered 2014-01-24: 18:00:00 via INTRAVENOUS

## 2014-01-24 MED ORDER — ALBUMIN HUMAN 5 % IV SOLN
INTRAVENOUS | Status: DC | PRN
  Administered 2014-01-24: 19:00:00 via INTRAVENOUS

## 2014-01-24 MED ORDER — ONDANSETRON HCL 4 MG/2ML IJ SOLN
INTRAMUSCULAR | Status: AC
  Filled 2014-01-24: qty 2

## 2014-01-24 MED ORDER — FENTANYL CITRATE 0.05 MG/ML IJ SOLN
INTRAMUSCULAR | Status: AC
  Filled 2014-01-24: qty 5

## 2014-01-24 MED ORDER — FENTANYL CITRATE 0.05 MG/ML IJ SOLN
INTRAMUSCULAR | Status: DC | PRN
  Administered 2014-01-24: 25 ug via INTRAVENOUS
  Administered 2014-01-24 (×3): 50 ug via INTRAVENOUS
  Administered 2014-01-24: 25 ug via INTRAVENOUS
  Administered 2014-01-24: 50 ug via INTRAVENOUS

## 2014-01-24 MED ORDER — BUPIVACAINE-EPINEPHRINE (PF) 0.25% -1:200000 IJ SOLN
INTRAMUSCULAR | Status: AC
  Filled 2014-01-24: qty 30

## 2014-01-24 MED ORDER — LIDOCAINE HCL (CARDIAC) 20 MG/ML IV SOLN
INTRAVENOUS | Status: DC | PRN
  Administered 2014-01-24: 60 mg via INTRAVENOUS

## 2014-01-24 MED ORDER — BUPIVACAINE HCL (PF) 0.25 % IJ SOLN
INTRAMUSCULAR | Status: AC
  Filled 2014-01-24: qty 30

## 2014-01-24 MED ORDER — ONDANSETRON HCL 4 MG/2ML IJ SOLN
INTRAMUSCULAR | Status: DC | PRN
  Administered 2014-01-24: 4 mg via INTRAVENOUS

## 2014-01-24 MED ORDER — PROPOFOL 10 MG/ML IV BOLUS
INTRAVENOUS | Status: AC
  Filled 2014-01-24: qty 20

## 2014-01-24 MED ORDER — SODIUM CHLORIDE 0.9 % IR SOLN
Status: DC | PRN
  Administered 2014-01-24 (×2): 1

## 2014-01-24 MED ORDER — ROCURONIUM BROMIDE 100 MG/10ML IV SOLN
INTRAVENOUS | Status: DC | PRN
  Administered 2014-01-24: 20 mg via INTRAVENOUS

## 2014-01-24 MED ORDER — SUCCINYLCHOLINE CHLORIDE 20 MG/ML IJ SOLN
INTRAMUSCULAR | Status: DC | PRN
  Administered 2014-01-24: 120 mg via INTRAVENOUS

## 2014-01-24 MED ORDER — PHENYLEPHRINE HCL 10 MG/ML IJ SOLN
10.0000 mg | INTRAVENOUS | Status: DC | PRN
  Administered 2014-01-24: 25 ug/min via INTRAVENOUS

## 2014-01-24 MED ORDER — ALBUTEROL SULFATE HFA 108 (90 BASE) MCG/ACT IN AERS
INHALATION_SPRAY | RESPIRATORY_TRACT | Status: DC | PRN
  Administered 2014-01-24 (×2): 2 via RESPIRATORY_TRACT

## 2014-01-24 MED ORDER — PROPOFOL 10 MG/ML IV BOLUS
INTRAVENOUS | Status: DC | PRN
  Administered 2014-01-24: 150 mg via INTRAVENOUS

## 2014-01-24 MED ORDER — ALBUTEROL SULFATE HFA 108 (90 BASE) MCG/ACT IN AERS
INHALATION_SPRAY | RESPIRATORY_TRACT | Status: AC
  Filled 2014-01-24: qty 6.7

## 2014-01-24 MED ORDER — LIDOCAINE HCL (CARDIAC) 20 MG/ML IV SOLN
INTRAVENOUS | Status: AC
  Filled 2014-01-24: qty 5

## 2014-01-24 MED ORDER — SUCCINYLCHOLINE CHLORIDE 20 MG/ML IJ SOLN
INTRAMUSCULAR | Status: AC
  Filled 2014-01-24: qty 1

## 2014-01-24 MED ORDER — PHENYLEPHRINE HCL 10 MG/ML IJ SOLN
INTRAMUSCULAR | Status: AC
  Filled 2014-01-24: qty 1

## 2014-01-24 MED ORDER — PHENYLEPHRINE HCL 10 MG/ML IJ SOLN
INTRAMUSCULAR | Status: DC | PRN
  Administered 2014-01-24 (×7): 80 ug via INTRAVENOUS

## 2014-01-24 MED ORDER — GLYCOPYRROLATE 0.2 MG/ML IJ SOLN
INTRAMUSCULAR | Status: AC
Start: 2014-01-24 — End: 2014-01-24
  Filled 2014-01-24: qty 2

## 2014-01-24 MED ORDER — LACTATED RINGERS IV SOLN
INTRAVENOUS | Status: DC | PRN
  Administered 2014-01-24: 19:00:00 via INTRAVENOUS

## 2014-01-24 MED ORDER — GLYCOPYRROLATE 0.2 MG/ML IJ SOLN
INTRAMUSCULAR | Status: DC | PRN
  Administered 2014-01-24: 0.4 mg via INTRAVENOUS

## 2014-01-24 MED ORDER — MIDAZOLAM HCL 2 MG/2ML IJ SOLN
INTRAMUSCULAR | Status: AC
  Filled 2014-01-24: qty 2

## 2014-01-24 MED ORDER — NEOSTIGMINE METHYLSULFATE 10 MG/10ML IV SOLN
INTRAVENOUS | Status: DC | PRN
  Administered 2014-01-24: 3 mg via INTRAVENOUS

## 2014-01-24 MED ORDER — NEOSTIGMINE METHYLSULFATE 10 MG/10ML IV SOLN
INTRAVENOUS | Status: AC
  Filled 2014-01-24: qty 1

## 2014-01-24 SURGICAL SUPPLY — 47 items
BIT DRILL 2.3 QUICK RELEASE (BIT) ×1 IMPLANT
BIT DRILL 2.8X5 QR DISP (BIT) ×2 IMPLANT
BIT DRILL QUICK RELEASE 2.0MM (INSTRUMENTS) ×1 IMPLANT
CHLORAPREP W/TINT 26ML (MISCELLANEOUS) ×2 IMPLANT
CLSR STERI-STRIP ANTIMIC 1/2X4 (GAUZE/BANDAGES/DRESSINGS) ×2 IMPLANT
COVER SURGICAL LIGHT HANDLE (MISCELLANEOUS) ×2 IMPLANT
DRAPE C-ARM 42X72 X-RAY (DRAPES) ×2 IMPLANT
DRAPE INCISE IOBAN 66X45 STRL (DRAPES) ×2 IMPLANT
DRAPE U-SHAPE 47X51 STRL (DRAPES) ×2 IMPLANT
DRILL 2.3 QUICK RELEASE (BIT) ×2
DRILL QUICK RELEASE 2.0MM (INSTRUMENTS) ×2
DRSG MEPILEX BORDER 4X4 (GAUZE/BANDAGES/DRESSINGS) ×2 IMPLANT
ELECT REM PT RETURN 9FT ADLT (ELECTROSURGICAL)
ELECTRODE REM PT RTRN 9FT ADLT (ELECTROSURGICAL) IMPLANT
GLOVE BIO SURGEON STRL SZ7 (GLOVE) ×2 IMPLANT
GLOVE BIO SURGEON STRL SZ7.5 (GLOVE) ×2 IMPLANT
GLOVE BIOGEL PI IND STRL 7.0 (GLOVE) ×1 IMPLANT
GLOVE BIOGEL PI IND STRL 8 (GLOVE) ×1 IMPLANT
GLOVE BIOGEL PI INDICATOR 7.0 (GLOVE) ×1
GLOVE BIOGEL PI INDICATOR 8 (GLOVE) ×1
GOWN STRL REUS W/ TWL LRG LVL3 (GOWN DISPOSABLE) ×3 IMPLANT
GOWN STRL REUS W/ TWL XL LVL3 (GOWN DISPOSABLE) ×1 IMPLANT
GOWN STRL REUS W/TWL LRG LVL3 (GOWN DISPOSABLE) ×3
GOWN STRL REUS W/TWL XL LVL3 (GOWN DISPOSABLE) ×1
KIT BASIN OR (CUSTOM PROCEDURE TRAY) ×2 IMPLANT
KIT ROOM TURNOVER OR (KITS) ×2 IMPLANT
MANIFOLD NEPTUNE WASTE (CANNULA) ×2 IMPLANT
NEEDLE HYPO 25GX1X1/2 BEV (NEEDLE) ×2 IMPLANT
NEEDLE SPNL 18GX3.5 QUINCKE PK (NEEDLE) ×2 IMPLANT
NS IRRIG 1000ML POUR BTL (IV SOLUTION) ×2 IMPLANT
PACK SHOULDER (CUSTOM PROCEDURE TRAY) ×2 IMPLANT
PAD ARMBOARD 7.5X6 YLW CONV (MISCELLANEOUS) ×4 IMPLANT
PLATE 6 H SM LOCKING RT CLAV (Plate) ×2 IMPLANT
SCREW CORTICAL 2.3X10MM (Screw) ×2 IMPLANT
SCREW HEXALOBE NON-LOCK 3.5X16 (Screw) ×10 IMPLANT
SCREW NON LOCK 3.5X10MM (Screw) ×2 IMPLANT
SCREW NONLOCK HEX 3.5X12 (Screw) ×2 IMPLANT
SLING ARM SM FOAM STRAP (SOFTGOODS) ×2 IMPLANT
STRIP CLOSURE SKIN 1/2X4 (GAUZE/BANDAGES/DRESSINGS) ×2 IMPLANT
SUCTION FRAZIER TIP 10 FR DISP (SUCTIONS) ×2 IMPLANT
SUT MON AB 4-0 PC3 18 (SUTURE) ×2 IMPLANT
SUT PDS AB 1 CT  36 (SUTURE) ×1
SUT PDS AB 1 CT 36 (SUTURE) ×1 IMPLANT
SYR CONTROL 10ML LL (SYRINGE) ×2 IMPLANT
TOWEL OR 17X24 6PK STRL BLUE (TOWEL DISPOSABLE) ×2 IMPLANT
TOWEL OR 17X26 10 PK STRL BLUE (TOWEL DISPOSABLE) ×2 IMPLANT
WATER STERILE IRR 1000ML POUR (IV SOLUTION) ×2 IMPLANT
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
## 2014-01-24 NOTE — Interval H&P Note (Signed)
History and Physical Interval Note:  01/24/2014 6:11 PM  Rebecca Knapp  has presented today for surgery, with the diagnosis of right clavical fracture  The various methods of treatment have been discussed with the patient and family. After consideration of risks, benefits and other options for treatment, the patient has consented to  Procedure(s): OPEN REDUCTION INTERNAL FIXATION (ORIF) CLAVICULAR FRACTURE (Right) as a surgical intervention .  The patient's history has been reviewed, patient examined, no change in status, stable for surgery.  I have reviewed the patient's chart and labs.  Questions were answered to the patient's satisfaction.     Mable ParisHANDLER,Enrika Aguado WILLIAM

## 2014-01-24 NOTE — Anesthesia Postprocedure Evaluation (Signed)
  Anesthesia Post-op Note  Patient: Rebecca Knapp  Procedure(s) Performed: Procedure(s): OPEN REDUCTION INTERNAL FIXATION (ORIF) CLAVICULAR FRACTURE (Right)  Patient Location: PACU  Anesthesia Type:General  Level of Consciousness: awake  Airway and Oxygen Therapy: Patient Spontanous Breathing  Post-op Pain: mild  Post-op Assessment: Post-op Vital signs reviewed  Post-op Vital Signs: Reviewed  Last Vitals:  Filed Vitals:   01/24/14 2015  BP:   Pulse:   Temp: 37.3 C  Resp:     Complications: No apparent anesthesia complications
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
## 2014-01-24 NOTE — Progress Notes (Signed)
Report given to Jody, RN.

## 2014-01-24 NOTE — Progress Notes (Signed)
Pt to OR.

## 2014-01-24 NOTE — Progress Notes (Signed)
UR completed.  Trayden Brandy, RN BSN [facility] CCM Trauma/Neuro ICU Case Manager [phone]  

## 2014-01-24 NOTE — H&P (View-Only) (Signed)
Seen and agree with the above.  Displaced clavicle fracture with associated rib fractures.   Recommend surgical fixation to prevent risk of nonunion/malunion when safe from the standpoint of her lungs.  Surgery was discussed with the patient and her husband through their pastor over the telephone as an interpreter.

## 2014-01-24 NOTE — Anesthesia Procedure Notes (Signed)
Procedure Name: Intubation Date/Time: 01/24/2014 7:08 PM Performed by: Luster LandsbergHASE, Jeferson Boozer R Pre-anesthesia Checklist: Patient identified, Emergency Drugs available and Suction available Patient Re-evaluated:Patient Re-evaluated prior to inductionOxygen Delivery Method: Circle system utilized Preoxygenation: Pre-oxygenation with 100% oxygen Intubation Type: IV induction, Rapid sequence and Cricoid Pressure applied Laryngoscope Size: Mac and 3 Grade View: Grade I Tube type: Oral Tube size: 7.0 mm Number of attempts: 1 Airway Equipment and Method: Stylet Placement Confirmation: ETT inserted through vocal cords under direct vision,  positive ETCO2 and breath sounds checked- equal and bilateral Secured at: 21 cm Tube secured with: Tape Dental Injury: Teeth and Oropharynx as per pre-operative assessment
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
## 2014-01-24 NOTE — Anesthesia Preprocedure Evaluation (Signed)
Anesthesia Evaluation  Patient identified by MRN, date of birth, ID band Patient awake    Reviewed: Allergy & Precautions, H&P , NPO status , Patient's Chart, lab work & pertinent test results  Airway Mallampati: II      Dental   Pulmonary Current Smoker,  Smoker breath sounds clear to auscultation        Cardiovascular negative cardio ROS  Rhythm:Regular Rate:Normal     Neuro/Psych  Headaches,    GI/Hepatic Neg liver ROS, GERD-  ,  Endo/Other  negative endocrine ROS  Renal/GU negative Renal ROS     Musculoskeletal  (+) Arthritis -,   Abdominal   Peds  Hematology   Anesthesia Other Findings   Reproductive/Obstetrics                           Anesthesia Physical Anesthesia Plan  ASA: III  Anesthesia Plan: General   Post-op Pain Management:    Induction: Intravenous  Airway Management Planned: Oral ETT  Additional Equipment:   Intra-op Plan:   Post-operative Plan: Possible Post-op intubation/ventilation  Informed Consent: I have reviewed the patients History and Physical, chart, labs and discussed the procedure including the risks, benefits and alternatives for the proposed anesthesia with the patient or authorized representative who has indicated his/her understanding and acceptance.   Dental advisory given  Plan Discussed with: CRNA and Anesthesiologist  Anesthesia Plan Comments:         Anesthesia Quick Evaluation

## 2014-01-24 NOTE — Progress Notes (Signed)
OT Cancellation Note  Patient Details Name: Rebecca Knapp MRN: [id number] DOB: 04/08/1953   Cancelled Treatment:    Reason Eval/Treat Not Completed: Medical issues which prohibited therapy;Other (comment) Pt scheduled for surgery today. Will see in am if appropriate. [facility] Hospital[facility],HILLARY [name], OTR/L  [phone] 01/24/2014 01/24/2014, 1:51 PM
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
## 2014-01-24 NOTE — Transfer of Care (Signed)
Immediate Anesthesia Transfer of Care Note  Patient: Rebecca Knapp  Procedure(s) Performed: Procedure(s): OPEN REDUCTION INTERNAL FIXATION (ORIF) CLAVICULAR FRACTURE (Right)  Patient Location: PACU  Anesthesia Type:General  Level of Consciousness: alert   Airway & Oxygen Therapy: Patient Spontanous Breathing and Patient connected to nasal cannula oxygen  Post-op Assessment: Report given to PACU RN, Post -op Vital signs reviewed and stable and Patient moving all extremities  Post vital signs: Reviewed and stable  Complications: No apparent anesthesia complications

## 2014-01-24 NOTE — Progress Notes (Signed)
Needs IS at bedside.  To go to the OR later today.  This patient has been seen and I agree with the findings and treatment plan.  Marta LamasJames O. Gae BonWyatt, III, MD, FACS [phone] (pager) [phone] (direct pager) Trauma Surgeon

## 2014-01-24 NOTE — Progress Notes (Addendum)
Pt c/o increased abd pain and unable to urinate and headache. MD made aware and gave orders. Will continue to monitor pt.

## 2014-01-25 ENCOUNTER — Inpatient Hospital Stay (HOSPITAL_COMMUNITY): Payer: BC Managed Care – PPO

## 2014-01-25 ENCOUNTER — Encounter (HOSPITAL_COMMUNITY): Payer: Self-pay | Admitting: Orthopedic Surgery

## 2014-01-25 LAB — BASIC METABOLIC PANEL
Anion gap: 13 (ref 5–15)
BUN: 7 mg/dL (ref 6–23)
CO2: 21 meq/L (ref 19–32)
CREATININE: 0.66 mg/dL (ref 0.50–1.10)
Calcium: 8.4 mg/dL (ref 8.4–10.5)
Chloride: 101 mEq/L (ref 96–112)
GFR calc Af Amer: >90 mL/min (ref >90)
Glucose, Bld: 101 mg/dL — ABNORMAL HIGH (ref 70–99)
Potassium: 3.7 mEq/L (ref 3.7–5.3)
Sodium: 135 mEq/L — ABNORMAL LOW (ref 137–147)

## 2014-01-25 LAB — CBC
HCT: 29.1 % — ABNORMAL LOW (ref 36.0–46.0)
HEMOGLOBIN: 8.9 g/dL — AB (ref 12.0–15.0)
MCH: 23.1 pg — AB (ref 26.0–34.0)
MCHC: 30.6 g/dL (ref 30.0–36.0)
MCV: 75.6 fL — AB (ref 78.0–100.0)
Platelets: 181 10*3/uL (ref 150–400)
RBC: 3.85 MIL/uL — ABNORMAL LOW (ref 3.87–5.11)
RDW: 16 % — ABNORMAL HIGH (ref 11.5–15.5)
WBC: 9.3 10*3/uL (ref 4.0–10.5)

## 2014-01-25 MED ORDER — SODIUM CHLORIDE 0.9 % IV SOLN
INTRAVENOUS | Status: DC
  Administered 2014-01-25 – 2014-01-26 (×2): via INTRAVENOUS

## 2014-01-25 MED ORDER — CEFAZOLIN SODIUM 1-5 GM-% IV SOLN
1.0000 g | Freq: Once | INTRAVENOUS | Status: AC
  Administered 2014-01-25: 1 g via INTRAVENOUS
  Filled 2014-01-25: qty 50

## 2014-01-25 MED ORDER — OXYCODONE HCL 5 MG PO TABS
10.0000 mg | ORAL_TABLET | ORAL | Status: DC | PRN
  Administered 2014-01-26 (×4): 10 mg via ORAL
  Administered 2014-01-27: 15 mg via ORAL
  Filled 2014-01-25: qty 3
  Filled 2014-01-25 (×4): qty 2

## 2014-01-25 NOTE — Progress Notes (Signed)
   PATIENT ID: Rebecca Knapp   1 Day Post-Op Procedure(s) (LRB): OPEN REDUCTION INTERNAL FIXATION (ORIF) CLAVICULAR FRACTURE (Right)  Subjective: Translator not in the room. Patient appears comfortable. Spontaneously moving right arm in sling.   Objective:  Filed Vitals:   01/25/14 0400  BP: 84/59  Pulse: 99  Temp:   Resp: 23     R clavicle dressing c/d/i R UE with no swelling, erythema, warmth Wiggles fingers and distally NVI  Labs:   Recent Labs  01/23/14 1110 01/23/14 1139 01/24/14 0318 01/25/14 0307  HGB 11.6* 14.3 9.5* 8.9*   Recent Labs  01/24/14 0318 01/25/14 0307  WBC 8.9 9.3  RBC 4.12 3.85*  HCT 30.6* 29.1*  PLT 223 181   Recent Labs  01/24/14 0318 01/25/14 0307  NA 139 135*  K 3.9 3.7  CL 106 101  CO2 19 21  BUN 10 7  CREATININE 0.60 0.66  GLUCOSE 105* 101*  CALCIUM 8.7 8.4    Assessment and Plan: 1 day s/p ORIF right clavicle  Sling for comfort Continue to ice site NWB right UE   VTE proph: per primary team

## 2014-01-25 NOTE — Progress Notes (Signed)
OT NOTE  Have called to set up an interpreter for the PT/OT evaluation. Will assess once interpreter secured. Thank you.  [facility] Marilyn Wing, OTR/L  [phone] 01/25/2014
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
## 2014-01-25 NOTE — Progress Notes (Signed)
Pt transferred via wheelchair with belongings to 5N26 per order, husband at bedside aware. Report called to Fleet Contrasachel, RN, CCMD notified of pt transfer.

## 2014-01-25 NOTE — Progress Notes (Signed)
Foley cath dc'd intact, patient tolerated without difficulty.  No complaints.

## 2014-01-25 NOTE — Progress Notes (Signed)
Good cough. Pulmonary toilet. Transfer to floor. Patient examined and I agree with the assessment and plan  Violeta GelinasBurke Amana Bouska, MD, MPH, FACS Trauma: [phone] General Surgery: [phone]  01/25/2014 1:13 PM

## 2014-01-26 LAB — CBC
HEMATOCRIT: 28.5 % — AB (ref 36.0–46.0)
Hemoglobin: 8.8 g/dL — ABNORMAL LOW (ref 12.0–15.0)
MCH: 23.7 pg — AB (ref 26.0–34.0)
MCHC: 30.9 g/dL (ref 30.0–36.0)
MCV: 76.8 fL — AB (ref 78.0–100.0)
PLATELETS: 177 10*3/uL (ref 150–400)
RBC: 3.71 MIL/uL — ABNORMAL LOW (ref 3.87–5.11)
RDW: 15.9 % — AB (ref 11.5–15.5)
WBC: 7.7 10*3/uL (ref 4.0–10.5)
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
## 2014-01-26 NOTE — Progress Notes (Signed)
   PATIENT ID: HLang Guard   2 Days Post-Op Procedure(s) (LRB): OPEN REDUCTION INTERNAL FIXATION (ORIF) CLAVICULAR FRACTURE (Right)  Subjective: No translator in the room. Points to her right ribs giving pain. Arm in sling.   Objective:  Filed Vitals:   01/26/14 0517  BP: 98/65  Pulse: 103  Temp: 98.3 F (36.8 C)  Resp: 20     R clavicle dressing c/d/i Arm in sling, wiggles fingers, no swelling distally, NVI  Labs:   Recent Labs  01/23/14 1110 01/23/14 1139 01/24/14 0318 01/25/14 0307 01/26/14 0401  HGB 11.6* 14.3 9.5* 8.9* 8.8*   Recent Labs  01/25/14 0307 01/26/14 0401  WBC 9.3 7.7  RBC 3.85* 3.71*  HCT 29.1* 28.5*  PLT 181 177   Recent Labs  01/24/14 0318 01/25/14 0307  NA 139 135*  K 3.9 3.7  CL 106 101  CO2 19 21  BUN 10 7  CREATININE 0.60 0.66  GLUCOSE 105* 101*  CALCIUM 8.7 8.4    Assessment and Plan: 2 day ss/p ORIF right clavicle  Sling for comfort  Continue to ice site  NWB right UE  OT- hand wrist elbow ROM only, sling edu  VTE proph: per primary team
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
## 2014-01-26 NOTE — Clinical Social Work Note (Signed)
CSW met with pt and pt's husband, Em Siu, at bedside to discuss discharge disposition. Pt's husband stated he currently works and is unable to be home throughout day. CSW consulted with CSW Surveyor, quantity regarding pt's appropriateness for SNF placement at this time. Pt is not eligible for LOG (Letter of Guarantee) for SNF placement per CSW Surveyor, quantity. CSW updated Trauma RNCM regarding information above. CSW signing off.  Lubertha Sayres, [place] [phone]) Licensed Clinical Social Worker Orthopedics [phone]) and Surgical [phone])

## 2014-01-27 ENCOUNTER — Encounter (HOSPITAL_COMMUNITY): Payer: Self-pay | Admitting: Orthopedic Surgery

## 2014-01-27 LAB — BASIC METABOLIC PANEL
Anion gap: 16 — ABNORMAL HIGH (ref 5–15)
BUN: 10 mg/dL (ref 6–23)
CALCIUM: 9.3 mg/dL (ref 8.4–10.5)
CO2: 19 mEq/L (ref 19–32)
CREATININE: 0.54 mg/dL (ref 0.50–1.10)
Chloride: 102 mEq/L (ref 96–112)
GFR calc non Af Amer: >90 mL/min (ref >90)
GLUCOSE: 102 mg/dL — AB (ref 70–99)
Potassium: 3.9 mEq/L (ref 3.7–5.3)
Sodium: 137 mEq/L (ref 137–147)

## 2014-01-27 LAB — CBC
HCT: 31.5 % — ABNORMAL LOW (ref 36.0–46.0)
HEMOGLOBIN: 9.6 g/dL — AB (ref 12.0–15.0)
MCH: 23.4 pg — AB (ref 26.0–34.0)
MCHC: 30.5 g/dL (ref 30.0–36.0)
MCV: 76.6 fL — ABNORMAL LOW (ref 78.0–100.0)
Platelets: 227 10*3/uL (ref 150–400)
RBC: 4.11 MIL/uL (ref 3.87–5.11)
RDW: 15.6 % — AB (ref 11.5–15.5)
WBC: 7.9 10*3/uL (ref 4.0–10.5)

## 2014-01-27 MED ORDER — GUAIFENESIN ER 600 MG PO TB12: 1200.0000 mg | ORAL_TABLET | Freq: Two times a day (BID) | ORAL | Status: DC

## 2014-01-27 MED ORDER — OXYCODONE-ACETAMINOPHEN 7.5-325 MG PO TABS: 1.0000 | ORAL_TABLET | ORAL | Status: DC | PRN

## 2014-01-27 MED ORDER — GUAIFENESIN ER 600 MG PO TB12
1200.0000 mg | ORAL_TABLET | Freq: Two times a day (BID) | ORAL | Status: DC
  Administered 2014-01-27: 1200 mg via ORAL
  Filled 2014-01-27 (×2): qty 2

## 2014-01-27 NOTE — Discharge Summary (Signed)
Okay to go home.  This patient has been seen and I agree with the findings and treatment plan.  Reid Nawrot O. Lyonel Morejon, III, MD, FACS [phone] (pager) [phone] (direct pager) Trauma Surgeon 
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
## 2014-01-27 NOTE — Discharge Instructions (Signed)
Do not use your right arm. Wear sling for comfort.  No driving while taking oxycodone.
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
## 2014-01-27 NOTE — Progress Notes (Signed)
Assisted patient walk without ambulate equipment on the hall  About 200 feet long. Patient gait steady. Discharge instruction given to patient and her husband via interpreter.

## 2014-01-27 NOTE — Progress Notes (Signed)
   PATIENT ID: Rebecca Knapp   3 Days Post-Op Procedure(s) (LRB): OPEN REDUCTION INTERNAL FIXATION (ORIF) CLAVICULAR FRACTURE (Right)  Subjective: No new c/o  Objective:  Filed Vitals:   01/27/14 0528  BP: 98/55  Pulse: 103  Temp: 98.4 F (36.9 C)  Resp: 18     R shoulder dressing c/d/i RUE NVID  Labs:   Recent Labs  01/25/14 0307 01/26/14 0401  HGB 8.9* 8.8*   Recent Labs  01/25/14 0307 01/26/14 0401  WBC 9.3 7.7  RBC 3.85* 3.71*  HCT 29.1* 28.5*  PLT 181 177   Recent Labs  01/25/14 0307  NA 135*  K 3.7  CL 101  CO2 21  BUN 7  CREATININE 0.66  GLUCOSE 101*  CALCIUM 8.4    Assessment and Plan: S/p ORIF R clavicle fracture Rec sling F/u 2 wks for recheck

## 2014-04-02 IMAGING — CR DG LUMBAR SPINE COMPLETE 4+V
5 series · 5 of 5 positions shown · non-contrast
Comparison: None.

CLINICAL DATA: Back pain for 2 years.

LUMBAR SPINE - COMPLETE 4+ VIEW

[view not recorded (1 of 5)]
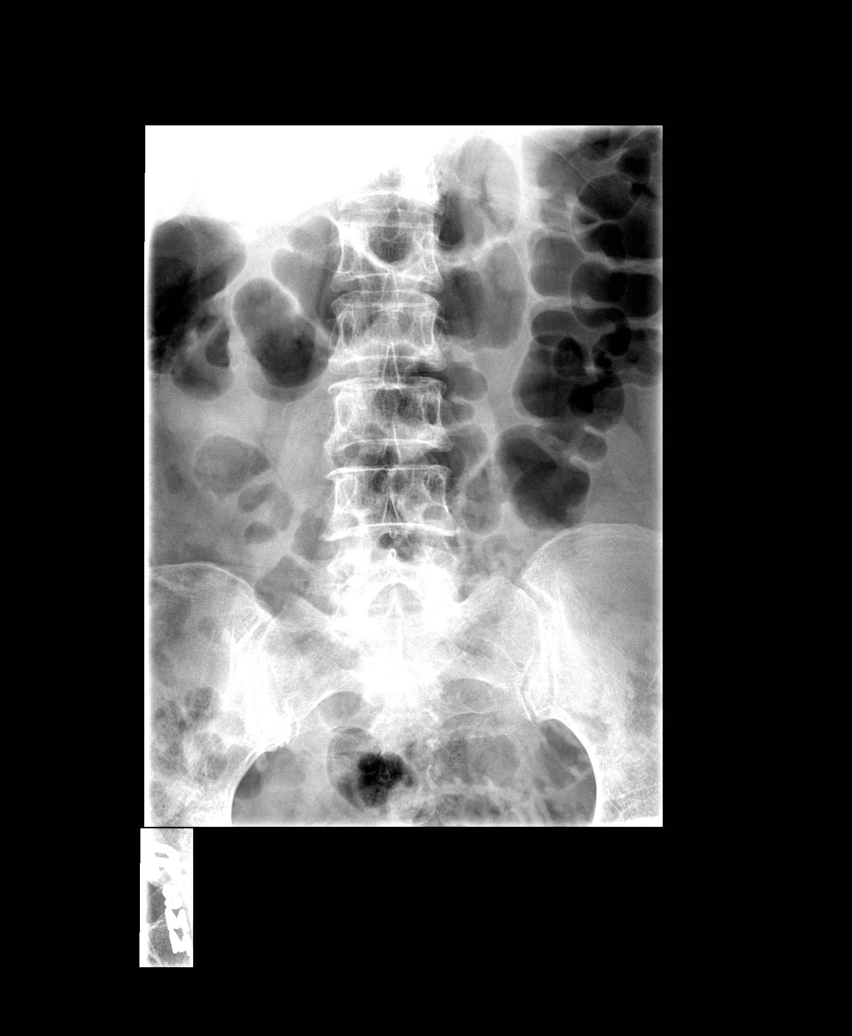

[view not recorded (2 of 5)]
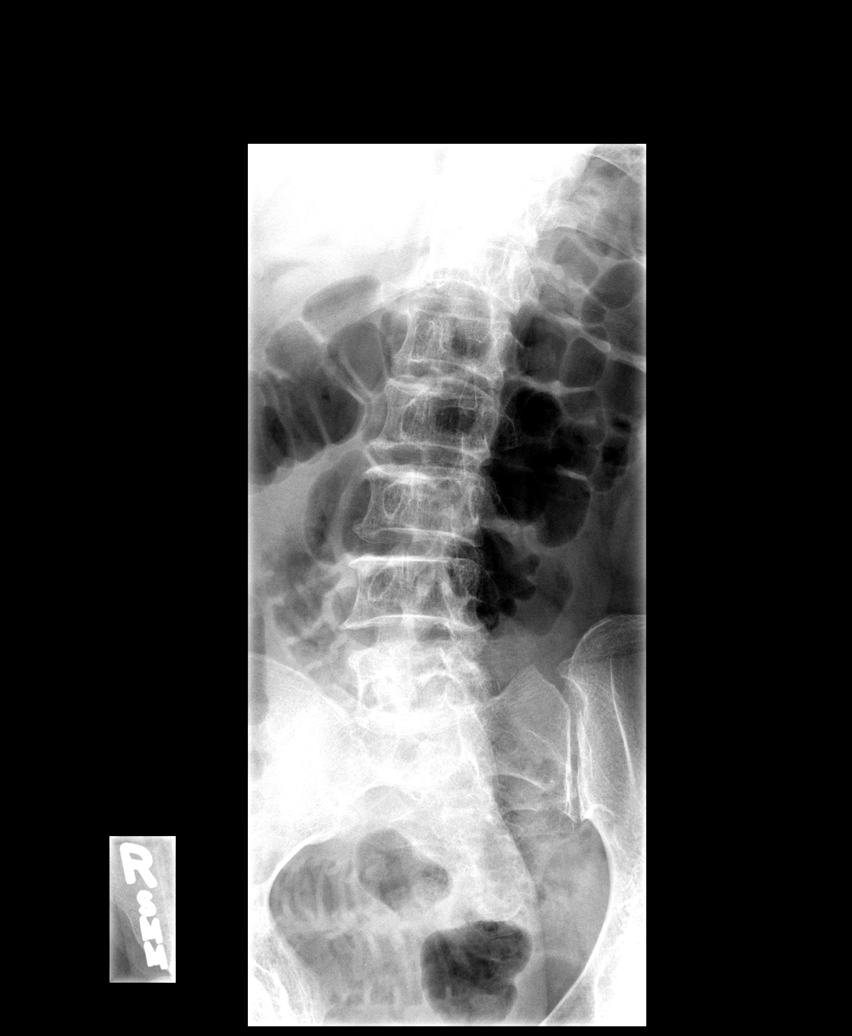

[view not recorded (3 of 5)]
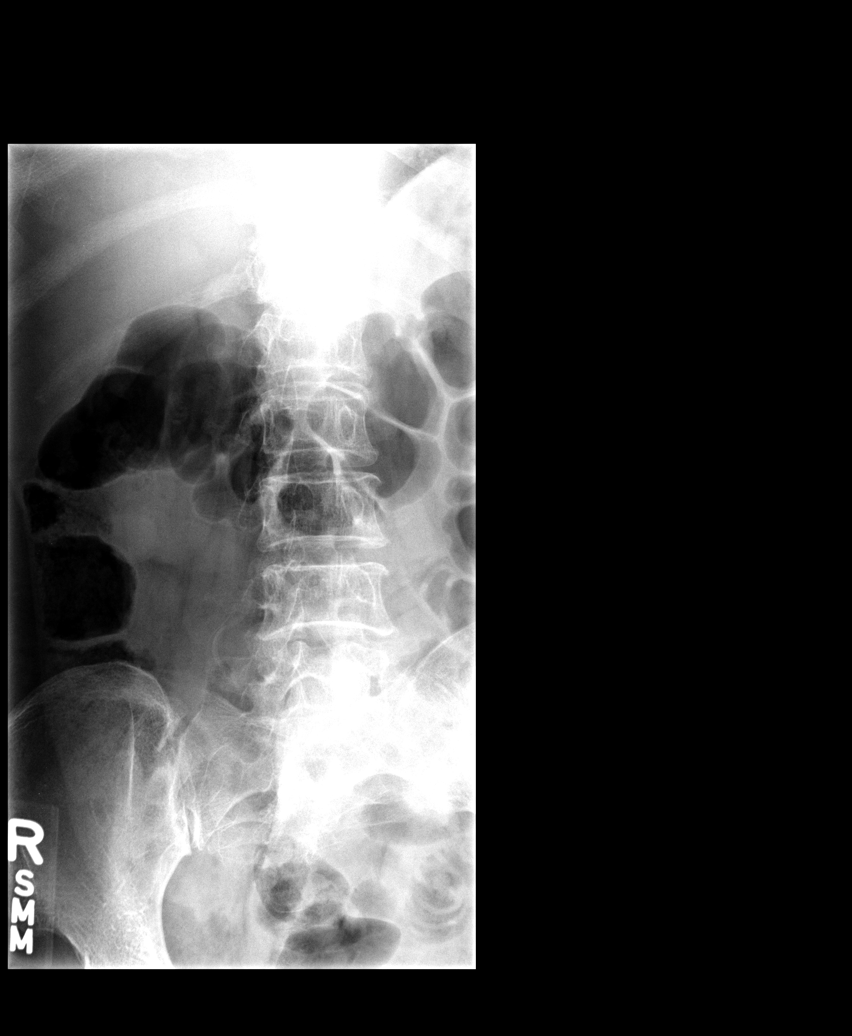

[view not recorded (4 of 5)]
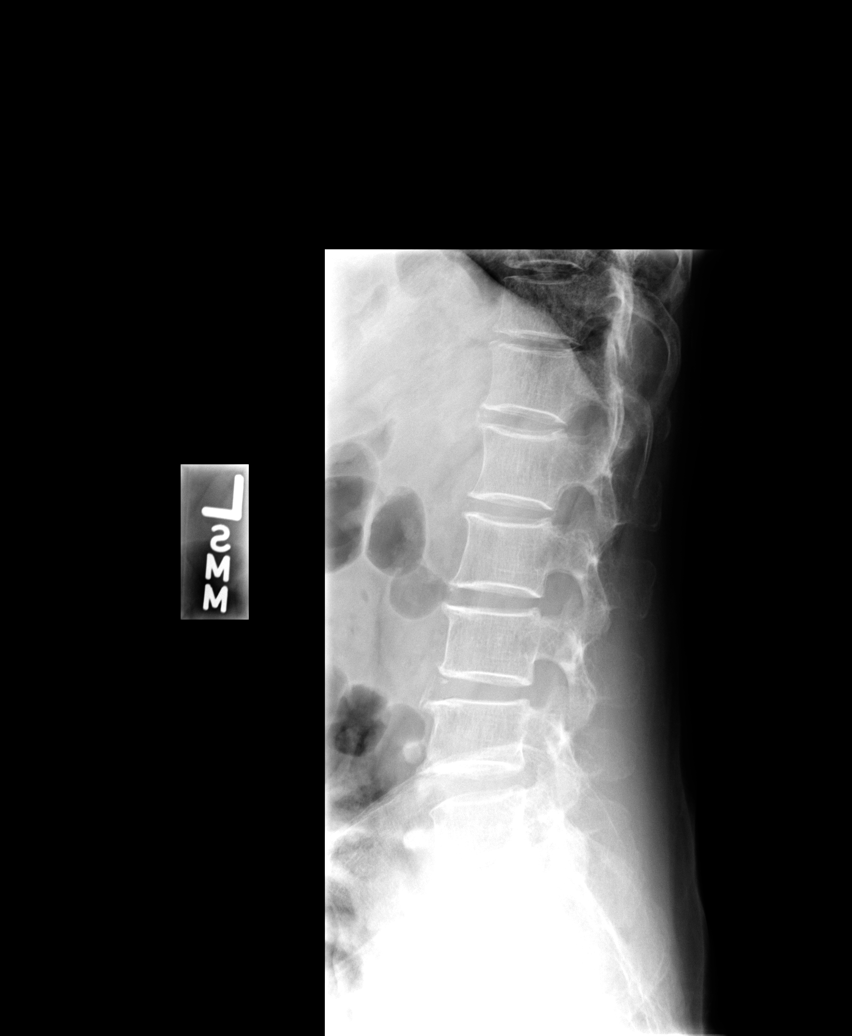

[view not recorded (5 of 5)]
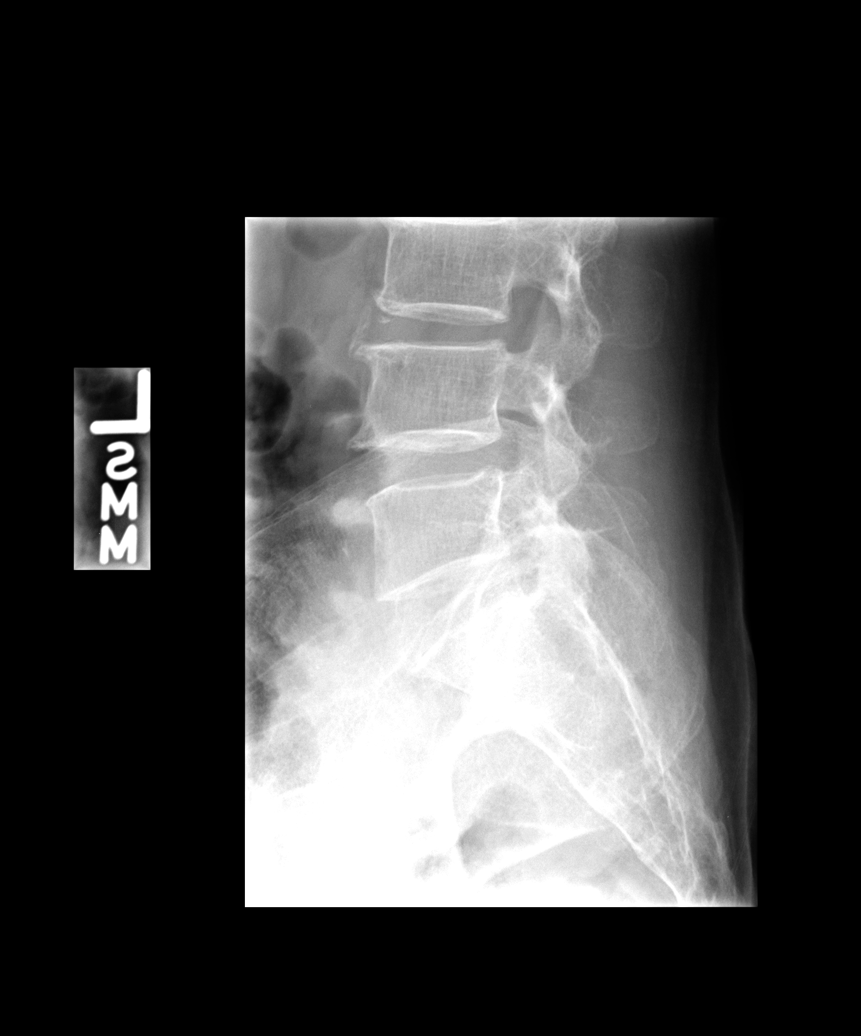

[5 of 5 positions shown; findings below may reference images not displayed]

FINDINGS: Five non-rib bearing lumbar type vertebral bodies are
present.  The vertebral body heights and alignment maintained.
Mild endplate degenerative changes are seen anteriorly at L2-3 L3-
4, and L4-5.  Mild osteopenia is evident.  The soft tissues are
unremarkable.
IMPRESSION: 1.  Mild endplate degenerative change.
2.  No acute abnormality.

## 2015-01-05 ENCOUNTER — Encounter: Payer: Self-pay | Admitting: Family Medicine

## 2015-01-05 ENCOUNTER — Ambulatory Visit: Payer: 59 | Attending: Family Medicine | Admitting: Family Medicine

## 2015-01-05 VITALS — BP 113/72 | HR 59 | Temp 98.2°F | Resp 16 | Ht <= 58 in | Wt 115.0 lb

## 2015-01-05 DIAGNOSIS — E042 Nontoxic multinodular goiter: Secondary | ICD-10-CM | POA: Diagnosis not present

## 2015-01-05 DIAGNOSIS — M25511 Pain in right shoulder: Secondary | ICD-10-CM

## 2015-01-05 DIAGNOSIS — G8929 Other chronic pain: Secondary | ICD-10-CM | POA: Diagnosis not present

## 2015-01-05 DIAGNOSIS — D509 Iron deficiency anemia, unspecified: Secondary | ICD-10-CM

## 2015-01-05 LAB — COMPLETE METABOLIC PANEL WITH GFR
ALK PHOS: 84 U/L (ref 33–130)
ALT: 20 U/L (ref 6–29)
AST: 23 U/L (ref 10–35)
Albumin: 4.6 g/dL (ref 3.6–5.1)
BUN: 13 mg/dL (ref 7–25)
CO2: 22 mmol/L (ref 20–31)
CREATININE: 0.68 mg/dL (ref 0.50–1.05)
Calcium: 9.9 mg/dL (ref 8.6–10.4)
Chloride: 106 mmol/L (ref 98–110)
GFR, Est African American: >89 mL/min (ref >=60)
Glucose, Bld: 71 mg/dL (ref 65–99)
Potassium: 4.8 mmol/L (ref 3.5–5.3)
SODIUM: 140 mmol/L (ref 135–146)
TOTAL PROTEIN: 8.3 g/dL — AB (ref 6.1–8.1)
Total Bilirubin: 0.5 mg/dL (ref 0.2–1.2)

## 2015-01-05 LAB — CBC
HCT: 38.1 % (ref 36.0–46.0)
Hemoglobin: 12.3 g/dL (ref 12.0–15.0)
MCH: 24.1 pg — AB (ref 26.0–34.0)
MCHC: 32.3 g/dL (ref 30.0–36.0)
MCV: 74.6 fL — AB (ref 78.0–100.0)
MPV: 10.6 fL (ref 8.6–12.4)
PLATELETS: 287 10*3/uL (ref 150–400)
RBC: 5.11 MIL/uL (ref 3.87–5.11)
RDW: 15.5 % (ref 11.5–15.5)
WBC: 7.3 10*3/uL (ref 4.0–10.5)

## 2015-01-05 LAB — IRON AND TIBC
%SAT: 19 % (ref 11–50)
Iron: 78 ug/dL (ref 45–160)
TIBC: 412 ug/dL (ref 250–450)
UIBC: 334 ug/dL (ref 125–400)

## 2015-01-05 MED ORDER — NAPROXEN 500 MG PO TABS: 500.0000 mg | ORAL_TABLET | Freq: Two times a day (BID) | ORAL | Status: DC | PRN

## 2015-01-05 MED ORDER — ACETAMINOPHEN-CODEINE #3 300-30 MG PO TABS: 1.0000 | ORAL_TABLET | Freq: Every evening | ORAL | Status: DC | PRN

## 2015-01-05 NOTE — Patient Instructions (Signed)
Rebecca Knapp,  Thank you for coming in today. It was a pleasure meeting you. I look forward to being your primary doctor.  Rebecca Knapp was seen today for establish care and shoulder pain.  Diagnoses and all orders for this visit:  Chronic right shoulder pain -     acetaminophen-codeine (TYLENOL #3) 300-30 MG tablet; Take 1 tablet by mouth at bedtime as needed for moderate pain. -     naproxen (NAPROSYN) 500 MG tablet; Take 1 tablet (500 mg total) by mouth 2 (two) times daily as needed for moderate pain (take with food). -     DG Shoulder Right; Future -     Ambulatory referral to Physical Therapy -     Ambulatory referral to Sports Medicine -     COMPLETE METABOLIC PANEL WITH GFR  Anemia, iron deficiency -     CBC -     Iron and TIBC -     Ferritin    Please go to medical records at the hospital to initiate a chart merge of your two medical records with different dates of birth   Inquire with your insurance company regarding coverage for sports medicine, PT and x-ray imaging  F/u in 3-4 weeks for f/u R shoulder pain  Dr. Armen Pickup  Acromioclavicular Separation with Rehab The acromioclavicular joint is the joint between the roof of the shoulder (acromion) and the collarbone (clavicle). It is vulnerable to injury. An acromioclavicular [facility]) separation is a partial or complete tear (sprain), injury, or redness and soreness (inflammation) of the ligaments that cross the acromioclavicular joint and hold it in place. There are two ligaments in this area that are vulnerable to injury, the acromioclavicular ligament and the coracoclavicular ligament. SYMPTOMS   Tenderness and swelling, or a bump on top of the shoulder (at the [facility] joint).  Bruising (contusion) in the area within 48 hours of injury.  Loss of strength or pain when reaching over the head or across the body. CAUSES  AC separation is caused by direct trauma to the joint (falling on your shoulder) or indirect trauma (falling on an  outstretched arm). RISK INCREASES WITH:  Sports that require contact or collision, throwing sports (i.e. racquetball, squash).  Poor strength and flexibility.  Previous shoulder sprain or dislocation.  Poorly fitted or padded protective equipment. PREVENTION   Warm-up and stretch properly before activity.  Maintain physical fitness:  Shoulder strength.  Shoulder flexibility.  Cardiovascular fitness.  Wear properly fitted and padded protective equipment.  Learn and use proper technique when playing sports. Have a coach correct improper technique, including falling and landing.  Apply taping, protective strapping or padding, or an adhesive bandage as recommended before practice or competition. PROGNOSIS   If treated properly, the symptoms of AC separation can be expected to go away.  If treated improperly, permanent disability may occur unless surgery is performed.  Healing time varies with type of sport and position, arm injured (dominant versus non-dominant) and severity of sprain. RELATED COMPLICATIONS  Weakness and fatigue of the arm or shoulder are possible but uncommon.  Pain and inflammation of the [facility] joint may continue.  Prolonged healing time may be necessary if usual activities are resumed too early. This causes a susceptibility to recurrent injury.  Prolonged disability may occur.  The shoulder may remain unstable or arthritic following repeated injury. TREATMENT  Treatment initially involves ice and medication to help reduce pain and inflammation. It may also be necessary to modify your activities in order to prevent further  injury. Both non-surgical and surgical interventions exist to treat AC separation. Non-surgical intervention is usually recommended and involves wearing a sling to immobilize the joint for a period of time to allow for healing. Surgical intervention is usually only considered for severe sprains of the ligament or for individuals who do  not improve after 2 to 6 months of non-surgical treatment. Surgical interventions require 4 to 6 months before a return to sports is possible. MEDICATION  If pain medication is necessary, nonsteroidal anti-inflammatory medications, such as aspirin and ibuprofen, or other minor pain relievers, such as acetaminophen, are often recommended.  Do not take pain medication for 7 days before surgery.  Prescription pain relievers may be given by your caregiver. Use only as directed and only as much as you need.  Ointments applied to the skin may be helpful.  Corticosteroid injections may be given to reduce inflammation. HEAT AND COLD  Cold treatment (icing) relieves pain and reduces inflammation. Cold treatment should be applied for 10 to 15 minutes every 2 to 3 hours for inflammation and pain and immediately after any activity that aggravates your symptoms. Use ice packs or an ice massage.  Heat treatment may be used prior to performing the stretching and strengthening activities prescribed by your caregiver, physical therapist or athletic trainer. Use a heat pack or a warm soak. SEEK IMMEDIATE MEDICAL CARE IF:   Pain, swelling or bruising worsens despite treatment.  There is pain, numbness or coldness in the arm.  Discoloration appears in the fingernails.  New, unexplained symptoms develop. EXERCISES  RANGE OF MOTION (ROM) AND STRETCHING EXERCISES - Acromioclavicular Separation These exercises may help you when beginning to rehabilitate your injury. Your symptoms may resolve with or without further involvement from your physician, physical therapist or athletic trainer. While completing these exercises, remember:  Restoring tissue flexibility helps normal motion to return to the joints. This allows healthier, less painful movement and activity.  An effective stretch should be held for at least 30 seconds.  A stretch should never be painful. You should only feel a gentle lengthening or  release in the stretched tissue. ROM - Pendulum  Bend at the waist so that your right / left arm falls away from your body. Support yourself with your opposite hand on a solid surface, such as a table or a countertop.  Your right / left arm should be perpendicular to the ground. If it is not perpendicular, you need to lean over farther. Relax the muscles in your right / left arm and shoulder as much as possible.  Gently sway your hips and trunk so they move your right / left arm without any use of your right / left shoulder muscles.  Progress your movements so that your right / left arm moves side to side, then forward and backward, and finally, both clockwise and counterclockwise.  Complete __________ repetitions in each direction. Many people use this exercise to relieve discomfort in their shoulder as well as to gain range of motion. Repeat __________ times. Complete this exercise __________ times per day. STRETCH - Flexion, Seated   Sit in a firm chair so that your right / left forearm can rest on a table or countertop. Your right / left elbow should rest below the height of your shoulder so that your shoulder feels supported and not tense or uncomfortable.  Keeping your right / left shoulder relaxed, lean forward at your waist, allowing your right / left hand to slide forward. Bend forward until you feel  a moderate stretch in your shoulder, but before you feel an increase in your pain.  Hold __________ seconds. Slowly return to your starting position. Repeat __________ times. Complete this exercise __________ times per day. STRETCH - Flexion, Standing  Stand with good posture. With an underhand grip on your right / left and an overhand grip on the opposite hand, grasp a broomstick or cane so that your hands are a little more than shoulder-width apart.  Keeping your right / left elbow straight and shoulder muscles relaxed, push the stick with your opposite hand to raise your right / left  arm in front of your body and then overhead. Raise your arm until you feel a stretch in your right / left shoulder, but before you have increased shoulder pain.  Try to avoid shrugging your right / left shoulder as your arm rises by keeping your shoulder blade tucked down and toward your mid-back spine. Hold __________ seconds.  Slowly return to the starting position. Repeat __________ times. Complete this exercise __________ times per day. STRENGTHENING EXERCISES - Acromioclavicular Separation These exercises may help you when beginning to rehabilitate your injury. They may resolve your symptoms with or without further involvement from your physician, physical therapist or athletic trainer. While completing these exercises, remember:  Muscles can gain both the endurance and the strength needed for everyday activities through controlled exercises.  Complete these exercises as instructed by your physician, physical therapist or athletic trainer. Progress the resistance and repetitions only as guided.  You may experience muscle soreness or fatigue, but the pain or discomfort you are trying to eliminate should never worsen during these exercises. If this pain does worsen, stop and make certain you are following the directions exactly. If the pain is still present after adjustments, discontinue the exercise until you can discuss the trouble with your clinician. STRENGTH - Shoulder Abductors, Isometric   With good posture, stand or sit about 4-6 inches from a wall with your right / left side facing the wall.  Bend your right / left elbow. Gently press your right / left elbow into the wall. Increase the pressure gradually until you are pressing as hard as you can without shrugging your shoulder or increasing any shoulder discomfort.  Hold __________ seconds.  Release the tension slowly. Relax your shoulder muscles completely before you start the next repetition. Repeat __________ times. Complete  this exercise __________ times per day. STRENGTH - Internal Rotators, Isometric  Keep your right / left elbow at your side and bend it 90 degrees.  Step into a door frame so that the inside of your right / left wrist can press against the door frame without your upper arm leaving your side.  Gently press your right / left wrist into the door frame as if you were trying to draw the palm of your hand to your abdomen. Gradually increase the tension until you are pressing as hard as you can without shrugging your shoulder or increasing any shoulder discomfort.  Hold __________ seconds.  Release the tension slowly. Relax your shoulder muscles completely before you the next repetition. Repeat __________ times. Complete this exercise __________ times per day.  STRENGTH - External Rotators, Isometric  Keep your right / left elbow at your side and bend it 90 degrees.  Step into a door frame so that the outside of your right / left wrist can press against the door frame without your upper arm leaving your side.  Gently press your right / left wrist into  the door frame as if you were trying to swing the back of your hand away from your abdomen. Gradually increase the tension until you are pressing as hard as you can without shrugging your shoulder or increasing any shoulder discomfort.  Hold __________ seconds.  Release the tension slowly. Relax your shoulder muscles completely before you the next repetition. Repeat __________ times. Complete this exercise __________ times per day. STRENGTH - Internal Rotators  Secure a rubber exercise band/tubing to a fixed object so that it is at the same height as your right / left elbow when you are standing or sitting on a firm surface.  Stand or sit so that the secured exercise band/tubing is at your right / left side.  Bend your elbow 90 degrees. Place a folded towel or small pillow under your right / left arm so that your elbow is a few inches away from  your side.  Keeping the tension on the exercise band/tubing, pull it across your body toward your abdomen. Be sure to keep your body steady so that the movement is only coming from your shoulder rotating.  Hold __________ seconds. Release the tension in a controlled manner as you return to the starting position. Repeat __________ times. Complete this exercise __________ times per day. STRENGTH - External Rotators  Secure a rubber exercise band/tubing to a fixed object so that it is at the same height as your right / left elbow when you are standing or sitting on a firm surface.  Stand or sit so that the secured exercise band/tubing is at your side that is not injured.  Bend your elbow 90 degrees. Place a folded towel or small pillow under your right / left arm so that your elbow is a few inches away from your side.  Keeping the tension on the exercise band/tubing, pull it away from your body, as if pivoting on your elbow. Be sure to keep your body steady so that the movement is only coming from your shoulder rotating.  Hold __________ seconds. Release the tension in a controlled manner as you return to the starting position. Repeat __________ times. Complete this exercise __________ times per day. Document Released: 03/25/2005 Document Revised: 06/17/2011 Document Reviewed: 07/07/2008 [facility] Patient Information 2015 [place], [place]. This information is not intended to replace advice given to you by your health care provider. Make sure you discuss any questions you have with your health care provider.
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
## 2015-01-06 LAB — FERRITIN: FERRITIN: 34 ng/mL (ref 10–291)

## 2015-01-19 ENCOUNTER — Ambulatory Visit (HOSPITAL_COMMUNITY)
Admission: RE | Admit: 2015-01-19 | Discharge: 2015-01-19 | Disposition: A | Discharge status: Home / Self Care | Payer: 59 | Source: Ambulatory Visit | Attending: Family Medicine | Admitting: Family Medicine

## 2015-01-19 DIAGNOSIS — M25511 Pain in right shoulder: Secondary | ICD-10-CM | POA: Insufficient documentation

## 2015-01-19 DIAGNOSIS — G8929 Other chronic pain: Secondary | ICD-10-CM | POA: Diagnosis not present

## 2015-01-23 ENCOUNTER — Encounter: Payer: Self-pay | Admitting: Sports Medicine

## 2015-01-23 ENCOUNTER — Ambulatory Visit (INDEPENDENT_AMBULATORY_CARE_PROVIDER_SITE_OTHER): Payer: 59 | Admitting: Sports Medicine

## 2015-01-23 VITALS — BP 138/79 | HR 78 | Ht <= 58 in | Wt 110.0 lb

## 2015-01-23 DIAGNOSIS — M25511 Pain in right shoulder: Secondary | ICD-10-CM

## 2015-01-23 DIAGNOSIS — G8929 Other chronic pain: Secondary | ICD-10-CM

## 2015-01-23 MED ORDER — METHYLPREDNISOLONE ACETATE 40 MG/ML IJ SUSP
40.0000 mg | Freq: Once | INTRAMUSCULAR | Status: AC
  Administered 2015-01-23: 40 mg via INTRA_ARTICULAR

## 2015-01-23 NOTE — Progress Notes (Signed)
Patient ID: Rebecca Knapp, female   DOB: 07/04/1960, 54 y.o.   MRN: [id number]   Interpreter for visit is Fernanda DrumH Wier Ciu
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
## 2015-02-20 ENCOUNTER — Other Ambulatory Visit: Payer: 59 | Admitting: Sports Medicine

## 2015-03-01 ENCOUNTER — Encounter: Payer: Self-pay | Admitting: Sports Medicine

## 2015-03-06 ENCOUNTER — Ambulatory Visit: Payer: 59 | Attending: Sports Medicine | Admitting: Physical Therapy

## 2015-03-06 DIAGNOSIS — M6281 Muscle weakness (generalized): Secondary | ICD-10-CM | POA: Insufficient documentation

## 2015-03-06 DIAGNOSIS — M25511 Pain in right shoulder: Secondary | ICD-10-CM

## 2015-03-06 DIAGNOSIS — R531 Weakness: Secondary | ICD-10-CM

## 2015-03-06 DIAGNOSIS — M25611 Stiffness of right shoulder, not elsewhere classified: Secondary | ICD-10-CM
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
## 2015-03-15 ENCOUNTER — Ambulatory Visit: Payer: 59 | Attending: Sports Medicine | Admitting: Physical Therapy

## 2015-03-15 DIAGNOSIS — R531 Weakness: Secondary | ICD-10-CM

## 2015-03-15 DIAGNOSIS — M25511 Pain in right shoulder: Secondary | ICD-10-CM | POA: Diagnosis not present

## 2015-03-15 DIAGNOSIS — M6281 Muscle weakness (generalized): Secondary | ICD-10-CM | POA: Diagnosis present

## 2015-03-15 DIAGNOSIS — M25611 Stiffness of right shoulder, not elsewhere classified: Secondary | ICD-10-CM
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
## 2015-03-15 NOTE — Patient Instructions (Signed)
Upright posture when using right arm

## 2015-03-17 ENCOUNTER — Ambulatory Visit: Payer: 59 | Admitting: Physical Therapy

## 2015-03-17 DIAGNOSIS — M25611 Stiffness of right shoulder, not elsewhere classified: Secondary | ICD-10-CM

## 2015-03-17 DIAGNOSIS — M25511 Pain in right shoulder: Secondary | ICD-10-CM | POA: Diagnosis not present

## 2015-03-17 DIAGNOSIS — R531 Weakness: Secondary | ICD-10-CM
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
## 2015-03-22 ENCOUNTER — Ambulatory Visit: Payer: 59 | Admitting: Physical Therapy

## 2015-03-22 DIAGNOSIS — M25511 Pain in right shoulder: Secondary | ICD-10-CM | POA: Diagnosis not present

## 2015-03-22 DIAGNOSIS — R531 Weakness: Secondary | ICD-10-CM

## 2015-03-22 DIAGNOSIS — M25611 Stiffness of right shoulder, not elsewhere classified: Secondary | ICD-10-CM
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
## 2015-03-24 ENCOUNTER — Ambulatory Visit: Payer: 59 | Admitting: Physical Therapy

## 2015-03-24 DIAGNOSIS — M25511 Pain in right shoulder: Secondary | ICD-10-CM

## 2015-03-24 DIAGNOSIS — M25611 Stiffness of right shoulder, not elsewhere classified: Secondary | ICD-10-CM

## 2015-03-24 DIAGNOSIS — R531 Weakness: Secondary | ICD-10-CM
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
## 2015-03-27 ENCOUNTER — Ambulatory Visit: Payer: 59

## 2015-03-27 ENCOUNTER — Encounter: Payer: Self-pay | Admitting: Sports Medicine

## 2015-03-27 ENCOUNTER — Ambulatory Visit (INDEPENDENT_AMBULATORY_CARE_PROVIDER_SITE_OTHER): Payer: 59 | Admitting: Sports Medicine

## 2015-03-27 VITALS — BP 110/60

## 2015-03-27 DIAGNOSIS — G8929 Other chronic pain: Secondary | ICD-10-CM

## 2015-03-27 DIAGNOSIS — M25511 Pain in right shoulder: Secondary | ICD-10-CM

## 2015-03-27 MED ORDER — NAPROXEN 500 MG PO TABS: 500.0000 mg | ORAL_TABLET | Freq: Two times a day (BID) | ORAL | Status: DC | PRN

## 2015-03-27 MED ORDER — ACETAMINOPHEN-CODEINE #3 300-30 MG PO TABS: 1.0000 | ORAL_TABLET | Freq: Every evening | ORAL | Status: DC | PRN

## 2015-03-27 NOTE — Patient Instructions (Signed)
[facility] [address] Ave Filterhandler Wednesday December 21st at 2:45p [address], [place], [place] [address] Hours: Open today  8AM-5:30PM Phone: [phone]

## 2015-03-27 NOTE — Progress Notes (Signed)
Patient ID: Rebecca Knapp, female   DOB: 10/20/1960, 54 y.o.   MRN: [id number]  Patient comes in today for follow-up on right shoulder pain. Subacromial cortisone injection did not help her symptoms. Physical therapy has not been helpful either. Pain continues to be diffuse throughout the shoulder. She is status post previous ORIF done by Dr. Ave Filterhandler last year. In addition to pain she continues to complain of popping in the shoulder.  Physical exam was somewhat limited today by pain  At this point I think it would be best that she return to Dr. Ave Filterhandler for his input. I had originally planned for an ultrasound of the shoulder but given her lack of response to a diagnostic subacromial cortisone injection, I'm not sure that her symptoms are originating from her rotator cuff. It is possible that some or all of her symptoms may be originating from her hardware. I'll defer further workup and treatment to the discretion of Dr. Ave Filterhandler. He may want to proceed with further diagnostic imaging prior to considering anything surgical.  Total time spent with the patient was 10 minutes with 100% of the time spent in face-to-face consultation discussing diagnosis and treatment. Please note that one of the patient's friends from church was present during today's office visit and acted as Equities traderinterpreter.

## 2015-05-09 ENCOUNTER — Other Ambulatory Visit: Payer: Self-pay | Admitting: Orthopedic Surgery

## 2015-05-17 ENCOUNTER — Encounter (HOSPITAL_BASED_OUTPATIENT_CLINIC_OR_DEPARTMENT_OTHER): Payer: Self-pay | Admitting: *Deleted

## 2015-05-22 ENCOUNTER — Ambulatory Visit (HOSPITAL_BASED_OUTPATIENT_CLINIC_OR_DEPARTMENT_OTHER)
Admission: RE | Admit: 2015-05-22 | Discharge: 2015-05-22 | Disposition: A | Discharge status: Home / Self Care | Payer: 59 | Source: Ambulatory Visit | Attending: Orthopedic Surgery | Admitting: Orthopedic Surgery

## 2015-05-22 ENCOUNTER — Encounter (HOSPITAL_BASED_OUTPATIENT_CLINIC_OR_DEPARTMENT_OTHER)
Admission: RE | Disposition: A | Discharge status: Home / Self Care | Payer: Self-pay | Source: Ambulatory Visit | Attending: Orthopedic Surgery

## 2015-05-22 ENCOUNTER — Ambulatory Visit (HOSPITAL_BASED_OUTPATIENT_CLINIC_OR_DEPARTMENT_OTHER): Payer: 59 | Admitting: Anesthesiology

## 2015-05-22 ENCOUNTER — Encounter (HOSPITAL_BASED_OUTPATIENT_CLINIC_OR_DEPARTMENT_OTHER): Payer: Self-pay | Admitting: Anesthesiology

## 2015-05-22 DIAGNOSIS — T8484XA Pain due to internal orthopedic prosthetic devices, implants and grafts, initial encounter: Secondary | ICD-10-CM | POA: Diagnosis not present

## 2015-05-22 DIAGNOSIS — F17210 Nicotine dependence, cigarettes, uncomplicated: Secondary | ICD-10-CM | POA: Diagnosis not present

## 2015-05-22 DIAGNOSIS — Y838 Other surgical procedures as the cause of abnormal reaction of the patient, or of later complication, without mention of misadventure at the time of the procedure: Secondary | ICD-10-CM | POA: Insufficient documentation

## 2015-05-22 DIAGNOSIS — M25511 Pain in right shoulder: Secondary | ICD-10-CM | POA: Diagnosis present

## 2015-05-22 HISTORY — DX: Pain due to internal orthopedic prosthetic devices, implants and grafts, initial encounter: T84.84XA

## 2015-05-22 HISTORY — PX: HARDWARE REMOVAL: SHX979

## 2015-05-22 SURGERY — REMOVAL, HARDWARE
Anesthesia: General | Site: Shoulder | Laterality: Right

## 2015-05-22 MED ORDER — MIDAZOLAM HCL 2 MG/2ML IJ SOLN
1.0000 mg | INTRAMUSCULAR | Status: DC | PRN
  Administered 2015-05-22: 2 mg via INTRAVENOUS

## 2015-05-22 MED ORDER — ONDANSETRON HCL 4 MG/2ML IJ SOLN
INTRAMUSCULAR | Status: DC | PRN
  Administered 2015-05-22: 4 mg via INTRAVENOUS

## 2015-05-22 MED ORDER — FENTANYL CITRATE (PF) 100 MCG/2ML IJ SOLN
INTRAMUSCULAR | Status: AC
  Filled 2015-05-22: qty 2

## 2015-05-22 MED ORDER — HYDROMORPHONE HCL 1 MG/ML IJ SOLN
INTRAMUSCULAR | Status: AC
  Filled 2015-05-22: qty 1

## 2015-05-22 MED ORDER — OXYCODONE-ACETAMINOPHEN 5-325 MG PO TABS: 1.0000 | ORAL_TABLET | Freq: Four times a day (QID) | ORAL | Status: DC | PRN

## 2015-05-22 MED ORDER — LACTATED RINGERS IV SOLN
INTRAVENOUS | Status: DC
  Administered 2015-05-22: 11:00:00 via INTRAVENOUS

## 2015-05-22 MED ORDER — HYDROMORPHONE HCL 1 MG/ML IJ SOLN
0.2500 mg | INTRAMUSCULAR | Status: DC | PRN
  Administered 2015-05-22: 0.5 mg via INTRAVENOUS

## 2015-05-22 MED ORDER — PROPOFOL 10 MG/ML IV BOLUS
INTRAVENOUS | Status: DC | PRN
  Administered 2015-05-22: 140 mg via INTRAVENOUS

## 2015-05-22 MED ORDER — MIDAZOLAM HCL 2 MG/2ML IJ SOLN
INTRAMUSCULAR | Status: AC
  Filled 2015-05-22: qty 2

## 2015-05-22 MED ORDER — GLYCOPYRROLATE 0.2 MG/ML IJ SOLN: 0.2000 mg | Freq: Once | INTRAMUSCULAR | Status: DC | PRN

## 2015-05-22 MED ORDER — LIDOCAINE HCL (CARDIAC) 20 MG/ML IV SOLN
INTRAVENOUS | Status: DC | PRN
  Administered 2015-05-22: 60 mg via INTRAVENOUS

## 2015-05-22 MED ORDER — SUCCINYLCHOLINE CHLORIDE 20 MG/ML IJ SOLN
INTRAMUSCULAR | Status: DC | PRN
  Administered 2015-05-22: 80 mg via INTRAVENOUS

## 2015-05-22 MED ORDER — FENTANYL CITRATE (PF) 100 MCG/2ML IJ SOLN
50.0000 ug | INTRAMUSCULAR | Status: AC | PRN
  Administered 2015-05-22: 100 ug via INTRAVENOUS
  Administered 2015-05-22 (×2): 50 ug via INTRAVENOUS

## 2015-05-22 MED ORDER — PROMETHAZINE HCL 25 MG/ML IJ SOLN: 6.2500 mg | INTRAMUSCULAR | Status: DC | PRN

## 2015-05-22 MED ORDER — DEXAMETHASONE SODIUM PHOSPHATE 4 MG/ML IJ SOLN
INTRAMUSCULAR | Status: DC | PRN
  Administered 2015-05-22: 10 mg via INTRAVENOUS

## 2015-05-22 MED ORDER — PHENYLEPHRINE HCL 10 MG/ML IJ SOLN
INTRAMUSCULAR | Status: DC | PRN
Start: 2015-05-22 — End: 2015-05-22
  Administered 2015-05-22: 80 ug via INTRAVENOUS

## 2015-05-22 MED ORDER — CEFAZOLIN SODIUM-DEXTROSE 2-3 GM-% IV SOLR
2.0000 g | INTRAVENOUS | Status: AC
  Administered 2015-05-22: 2 g via INTRAVENOUS

## 2015-05-22 MED ORDER — PROPOFOL 500 MG/50ML IV EMUL
INTRAVENOUS | Status: AC
  Filled 2015-05-22: qty 50

## 2015-05-22 MED ORDER — LIDOCAINE HCL 4 % EX SOLN
CUTANEOUS | Status: DC | PRN
  Administered 2015-05-22: 3 mL via TOPICAL

## 2015-05-22 MED ORDER — LIDOCAINE HCL (CARDIAC) 20 MG/ML IV SOLN
INTRAVENOUS | Status: AC
  Filled 2015-05-22: qty 10

## 2015-05-22 MED ORDER — BUPIVACAINE-EPINEPHRINE 0.25% -1:200000 IJ SOLN
INTRAMUSCULAR | Status: DC | PRN
  Administered 2015-05-22: 10 mL

## 2015-05-22 MED ORDER — SCOPOLAMINE 1 MG/3DAYS TD PT72: 1.0000 | MEDICATED_PATCH | Freq: Once | TRANSDERMAL | Status: DC | PRN

## 2015-05-22 MED ORDER — POVIDONE-IODINE 7.5 % EX SOLN: Freq: Once | CUTANEOUS | Status: DC

## 2015-05-22 MED ORDER — ONDANSETRON HCL 4 MG/2ML IJ SOLN
INTRAMUSCULAR | Status: AC
  Filled 2015-05-22: qty 2

## 2015-05-22 MED ORDER — SUCCINYLCHOLINE CHLORIDE 20 MG/ML IJ SOLN
INTRAMUSCULAR | Status: AC
  Filled 2015-05-22: qty 1

## 2015-05-22 MED ORDER — DEXAMETHASONE SODIUM PHOSPHATE 10 MG/ML IJ SOLN
INTRAMUSCULAR | Status: AC
  Filled 2015-05-22: qty 1

## 2015-05-22 MED ORDER — BUPIVACAINE-EPINEPHRINE (PF) 0.5% -1:200000 IJ SOLN
INTRAMUSCULAR | Status: AC
  Filled 2015-05-22: qty 30

## 2015-05-22 SURGICAL SUPPLY — 68 items
BANDAGE ACE 4X5 VEL STRL LF (GAUZE/BANDAGES/DRESSINGS) IMPLANT
BANDAGE ACE 6X5 VEL STRL LF (GAUZE/BANDAGES/DRESSINGS) IMPLANT
BANDAGE ESMARK 6X9 LF (GAUZE/BANDAGES/DRESSINGS) IMPLANT
BENZOIN TINCTURE PRP APPL 2/3 (GAUZE/BANDAGES/DRESSINGS) ×2 IMPLANT
BLADE SURG 15 STRL LF DISP TIS (BLADE) ×1 IMPLANT
BLADE SURG 15 STRL SS (BLADE) ×1
BNDG COHESIVE 4X5 TAN STRL (GAUZE/BANDAGES/DRESSINGS) IMPLANT
BNDG ESMARK 4X9 LF (GAUZE/BANDAGES/DRESSINGS) IMPLANT
BNDG ESMARK 6X9 LF (GAUZE/BANDAGES/DRESSINGS)
CANISTER SUCT 1200ML W/VALVE (MISCELLANEOUS) IMPLANT
CHLORAPREP W/TINT 26ML (MISCELLANEOUS) ×2 IMPLANT
COVER BACK TABLE 60X90IN (DRAPES) ×2 IMPLANT
DECANTER SPIKE VIAL GLASS SM (MISCELLANEOUS) IMPLANT
DRAPE C-ARM 42X72 X-RAY (DRAPES) IMPLANT
DRAPE EXTREMITY T 121X128X90 (DRAPE) IMPLANT
DRAPE OEC MINIVIEW 54X84 (DRAPES) IMPLANT
DRAPE U 20/CS (DRAPES) ×2 IMPLANT
DRAPE U-SHAPE 47X51 STRL (DRAPES) IMPLANT
DRAPE U-SHAPE 76X120 STRL (DRAPES) ×2 IMPLANT
DRSG EMULSION OIL 3X3 NADH (GAUZE/BANDAGES/DRESSINGS) ×2 IMPLANT
ELECT REM PT RETURN 9FT ADLT (ELECTROSURGICAL) ×2
ELECTRODE REM PT RTRN 9FT ADLT (ELECTROSURGICAL) ×1 IMPLANT
GAUZE SPONGE 4X4 12PLY STRL (GAUZE/BANDAGES/DRESSINGS) ×2 IMPLANT
GAUZE SPONGE 4X4 16PLY XRAY LF (GAUZE/BANDAGES/DRESSINGS) IMPLANT
GLOVE BIO SURGEON STRL SZ7 (GLOVE) ×2 IMPLANT
GLOVE BIOGEL PI IND STRL 7.0 (GLOVE) ×1 IMPLANT
GLOVE BIOGEL PI IND STRL 8 (GLOVE) ×2 IMPLANT
GLOVE BIOGEL PI INDICATOR 7.0 (GLOVE) ×1
GLOVE BIOGEL PI INDICATOR 8 (GLOVE) ×2
GLOVE ECLIPSE 7.5 STRL STRAW (GLOVE) ×2 IMPLANT
GOWN STRL REUS W/ TWL LRG LVL3 (GOWN DISPOSABLE) ×2 IMPLANT
GOWN STRL REUS W/ TWL XL LVL3 (GOWN DISPOSABLE) ×1 IMPLANT
GOWN STRL REUS W/TWL LRG LVL3 (GOWN DISPOSABLE) ×2
GOWN STRL REUS W/TWL XL LVL3 (GOWN DISPOSABLE) ×1
NEEDLE HYPO 22GX1.5 SAFETY (NEEDLE) IMPLANT
NS IRRIG 1000ML POUR BTL (IV SOLUTION) ×2 IMPLANT
PACK ARTHROSCOPY DSU (CUSTOM PROCEDURE TRAY) ×2 IMPLANT
PACK BASIN DAY SURGERY FS (CUSTOM PROCEDURE TRAY) ×2 IMPLANT
PAD CAST 4YDX4 CTTN HI CHSV (CAST SUPPLIES) IMPLANT
PADDING CAST ABS 4INX4YD NS (CAST SUPPLIES) ×1
PADDING CAST ABS COTTON 4X4 ST (CAST SUPPLIES) ×1 IMPLANT
PADDING CAST COTTON 4X4 STRL (CAST SUPPLIES)
PADDING CAST COTTON 6X4 STRL (CAST SUPPLIES) IMPLANT
PENCIL BUTTON HOLSTER BLD 10FT (ELECTRODE) ×2 IMPLANT
SPLINT FAST PLASTER 5X30 (CAST SUPPLIES)
SPLINT PLASTER CAST FAST 5X30 (CAST SUPPLIES) IMPLANT
SPONGE LAP 4X18 X RAY DECT (DISPOSABLE) IMPLANT
STAPLER VISISTAT (STAPLE) IMPLANT
STOCKINETTE 6  STRL (DRAPES) ×1
STOCKINETTE 6 STRL (DRAPES) ×1 IMPLANT
STOCKINETTE IMPERVIOUS LG (DRAPES) IMPLANT
STRIP CLOSURE SKIN 1/2X4 (GAUZE/BANDAGES/DRESSINGS) ×2 IMPLANT
SUCTION FRAZIER HANDLE 10FR (MISCELLANEOUS) ×1
SUCTION TUBE FRAZIER 10FR DISP (MISCELLANEOUS) ×1 IMPLANT
SUT ETHILON 4 0 PS 2 18 (SUTURE) IMPLANT
SUT MON AB 4-0 PC3 18 (SUTURE) IMPLANT
SUT TIGER TAPE 7 IN WHITE (SUTURE) IMPLANT
SUT VIC AB 2-0 PS2 27 (SUTURE) ×2 IMPLANT
SUT VIC AB 2-0 SH 27 (SUTURE) ×1
SUT VIC AB 2-0 SH 27XBRD (SUTURE) ×1 IMPLANT
SUT VIC AB 3-0 FS2 27 (SUTURE) IMPLANT
SUT VICRYL 4-0 PS2 18IN ABS (SUTURE) IMPLANT
SYR BULB 3OZ (MISCELLANEOUS) ×2 IMPLANT
SYR CONTROL 10ML LL (SYRINGE) IMPLANT
TAPE FIBER 2MM 7IN #2 BLUE (SUTURE) IMPLANT
TOWEL OR NON WOVEN STRL DISP B (DISPOSABLE) ×2 IMPLANT
TUBE CONNECTING 20X1/4 (TUBING) IMPLANT
UNDERPAD 30X30 (UNDERPADS AND DIAPERS) IMPLANT

## 2015-05-22 NOTE — Anesthesia Postprocedure Evaluation (Signed)
Anesthesia Post Note  Patient: Rebecca Knapp  Procedure(s) Performed: Procedure(s) (LRB): Hardware removal right clavicle (Right)  Patient location during evaluation: PACU Anesthesia Type: General Level of consciousness: awake and alert Pain management: pain level controlled Vital Signs Assessment: post-procedure vital signs reviewed and stable Respiratory status: spontaneous breathing, nonlabored ventilation, respiratory function stable and patient connected to nasal cannula oxygen Cardiovascular status: blood pressure returned to baseline and stable Postop Assessment: no signs of nausea or vomiting Anesthetic complications: no    Last Vitals:  Filed Vitals:   05/22/15 1300 05/22/15 1346  BP: 126/80 122/88  Pulse: 80 68  Temp:  36.6 C  Resp: 19 18    Last Pain:  Filed Vitals:   05/22/15 1347  PainSc: 3                  Anaisha Mago J

## 2015-05-22 NOTE — Anesthesia Procedure Notes (Signed)
Procedure Name: Intubation Date/Time: 05/22/2015 11:44 AM Performed by: Burna Cash Pre-anesthesia Checklist: Patient identified, Emergency Drugs available, Suction available and Patient being monitored Patient Re-evaluated:Patient Re-evaluated prior to inductionOxygen Delivery Method: Circle System Utilized Preoxygenation: Pre-oxygenation with 100% oxygen Intubation Type: IV induction Ventilation: Mask ventilation without difficulty Laryngoscope Size: Mac and 3 Grade View: Grade I Tube type: Oral Tube size: 7.0 mm Number of attempts: 1 Airway Equipment and Method: Stylet and Oral airway Placement Confirmation: ETT inserted through vocal cords under direct vision,  positive ETCO2 and breath sounds checked- equal and bilateral Secured at: 19 cm Tube secured with: Tape Dental Injury: Teeth and Oropharynx as per pre-operative assessment

## 2015-05-22 NOTE — Discharge Instructions (Signed)
Discharge Instructions after Shoulder Surgery   Use ice on the shoulder intermittently over the first 48 hours after surgery.  Pain medication has been prescribed for you.  Use your medication liberally over the first 48 hours, and then begin to taper your use. You may take Extra Strength Tylenol or Tylenol only in place of the pain pills. DO NOT take ANY nonsteroidal anti-inflammatory pain medications: Advil, Motrin, Ibuprofen, Aleve, Naproxen, or Naprosyn.  You may remove your dressing after two days.  You may shower 5 days after surgery. The incision CANNOT get wet prior to 5 days. Simply allow the water to wash over the site and then pat dry. Do not rub the incision. Make sure your axilla (armpit) is completely dry after showering.  Take one aspirin a day for 2 weeks after surgery, unless you have an aspirin sensitivity/allergy or asthma.  Three to 5 times each day you should perform assisted overhead reaching and external rotation (outward turning) exercises with the operative arm. Both exercises should be done with the non-operative arm used as the "therapist arm" while the operative arm remains relaxed. Ten of each exercise should be done three to five times each day.    Overhead reach is helping to lift your stiff arm up as high as it will go. To stretch your overhead reach, lie flat on your back, relax, and grasp the wrist of the tight shoulder with your opposite hand. Using the power in your opposite arm, bring the stiff arm up as far as it is comfortable. Start holding it for ten seconds and then work up to where you can hold it for a count of 30. Breathe slowly and deeply while the arm is moved. Repeat this stretch ten times, trying to help the arm up a little higher each time.       External rotation is turning the arm out to the side while your elbow stays close to your body. External rotation is best stretched while you are lying on your back. Hold a cane, yardstick, broom  handle, or dowel in both hands. Bend both elbows to a right angle. Use steady, gentle force from your normal arm to rotate the hand of the stiff shoulder out away from your body. Continue the rotation as far as it will go comfortably, holding it there for a count of 10. Repeat this exercise ten times.     Please call [phone] during normal business hours or [phone] after hours for any problems. Including the following:  - excessive redness of the incisions - drainage for more than 4 days - fever of more than 101.5 F  *Please note that pain medications will not be refilled after hours or on weekends.     Post Anesthesia Home Care Instructions  Activity: Get plenty of rest for the remainder of the day. A responsible adult should stay with you for 24 hours following the procedure.  For the next 24 hours, DO NOT: -Drive a car -Advertising copywriter -Drink alcoholic beverages -Take any medication unless instructed by your physician -Make any legal decisions or sign important papers.  Meals: Start with liquid foods such as gelatin or soup. Progress to regular foods as tolerated. Avoid greasy, spicy, heavy foods. If nausea and/or vomiting occur, drink only clear liquids until the nausea and/or vomiting subsides. Call your physician if vomiting continues.  Special Instructions/Symptoms: Your throat may feel dry or sore from the anesthesia or the breathing tube placed in your throat during surgery. If this  causes discomfort, gargle with warm salt water. The discomfort should disappear within 24 hours.  If you had a scopolamine patch placed behind your ear for the management of post- operative nausea and/or vomiting:  1. The medication in the patch is effective for 72 hours, after which it should be removed.  Wrap patch in a tissue and discard in the trash. Wash hands thoroughly with soap and water. 2. You may remove the patch earlier than 72 hours if you experience unpleasant side  effects which may include dry mouth, dizziness or visual disturbances. 3. Avoid touching the patch. Wash your hands with soap and water after contact with the patch.

## 2015-05-22 NOTE — Anesthesia Preprocedure Evaluation (Addendum)
Anesthesia Evaluation  Patient identified by MRN, date of birth, ID band Patient awake    Reviewed: Allergy & Precautions, NPO status , Patient's Chart, lab work & pertinent test results  Airway Mallampati: II  TM Distance: >3 FB Neck ROM: Full    Dental no notable dental hx.    Pulmonary neg pulmonary ROS, Current Smoker,    Pulmonary exam normal breath sounds clear to auscultation       Cardiovascular negative cardio ROS Normal cardiovascular exam Rhythm:Regular Rate:Normal     Neuro/Psych  Headaches,  Neuromuscular disease negative psych ROS   GI/Hepatic Neg liver ROS, GERD  ,  Endo/Other  negative endocrine ROS  Renal/GU negative Renal ROS  negative genitourinary   Musculoskeletal  (+) Arthritis ,   Abdominal   Peds negative pediatric ROS (+)  Hematology  (+) anemia ,   Anesthesia Other Findings   Reproductive/Obstetrics negative OB ROS                             Anesthesia Physical Anesthesia Plan  ASA: II  Anesthesia Plan: General   Post-op Pain Management:    Induction: Intravenous  Airway Management Planned: LMA  Additional Equipment:   Intra-op Plan:   Post-operative Plan: Extubation in OR  Informed Consent: I have reviewed the patients History and Physical, chart, labs and discussed the procedure including the risks, benefits and alternatives for the proposed anesthesia with the patient or authorized representative who has indicated his/her understanding and acceptance.   Dental advisory given  Plan Discussed with: CRNA  Anesthesia Plan Comments:         Anesthesia Quick Evaluation

## 2015-05-22 NOTE — Op Note (Signed)
Procedure(s): Hardware removal right clavicle Procedure Note  Mackinzie Vuncannon Jankovich female 55 y.o. 05/22/2015  Procedure(s) and Anesthesia Type:    * Hardware removal right clavicle - General  Surgeon(s) and Role:    * Jones Broom, MD - Primary   Indications:  55 y.o. female s/p ORIF clavicle fracture that went on to heal without complication but she complained of some persistent discomfort over the plate and wished to have the plate removed.     Surgeon: Mable Paris   Assistants: Damita Lack PA-C [facility] was present and scrubbed throughout the procedure and was essential in positioning, retraction, exposure, and closure)  Anesthesia: General endotracheal anesthesia    Procedure Detail  Hardware removal right clavicle  Findings: The plate and 6 screws were removed without difficulty.  Estimated Blood Loss:  Minimal         Drains: none  Blood Given: none         Specimens: none        Complications:  * No complications entered in OR log *         Disposition: PACU - hemodynamically stable.         Condition: stable    Procedure:  DESCRIPTION OF PROCEDURE: The patient was identified in preoperative  holding area where I personally marked the operative site after  verifying site, side, and procedure with the patient. The patient was taken back  to the operating room where general anesthesia was induced without  complication and was placed in the beach-chair position with the back  elevated about 40 degrees and all extremities carefully padded and  positioned.   The previous incision was marked out and incised. Small skin flaps were elevated anterior and posterior. The overlying fascia over the plate was opened. Plate was cleaned of soft tissues in all 6 screws were backed out without difficulty. The plate was then removed. Areas where the screw holes were were lightly rongeured and smoothed. Copious irrigation was used. Fracture was completely  healed. No other pathology was noted.  The fascia was then closed with #0 vicryl sutures in  interrupted fashion. The skin was then closed with 2-0 Vicryl in a deep  dermal layer, 4-0 Monocryl for skin closure. Steri-Strips were applied.  10 mL of 0.25% Marcaine with epinephrine were infiltrated for  postoperative pain. Sterile dressings were applied including a medium  Mepilex dressing. The patient was then allowed to awaken from general  anesthesia, placed in a sling, transferred to stretcher and taken to the  recovery room in stable condition.   POSTOPERATIVE PLAN: She will be discharged home today with her family in follow-up in about 2 weeks for wound check.

## 2015-05-22 NOTE — Transfer of Care (Signed)
Immediate Anesthesia Transfer of Care Note  Patient: Rebecca Knapp  Procedure(s) Performed: Procedure(s): Hardware removal right clavicle (Right)  Patient Location: PACU  Anesthesia Type:General  Level of Consciousness: awake, alert  and oriented  Airway & Oxygen Therapy: Patient Spontanous Breathing and Patient connected to face mask oxygen  Post-op Assessment: Report given to RN and Post -op Vital signs reviewed and stable  Post vital signs: Reviewed and stable  Last Vitals:  Filed Vitals:   05/22/15 1058  BP: 140/91  Pulse: 87  Temp: 36.7 C  Resp: 18    Complications: No apparent anesthesia complications
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
## 2015-05-23 ENCOUNTER — Encounter (HOSPITAL_BASED_OUTPATIENT_CLINIC_OR_DEPARTMENT_OTHER): Payer: Self-pay | Admitting: Orthopedic Surgery

## 2016-06-11 ENCOUNTER — Ambulatory Visit (INDEPENDENT_AMBULATORY_CARE_PROVIDER_SITE_OTHER): Payer: 59 | Admitting: Physician Assistant

## 2016-06-11 ENCOUNTER — Encounter (INDEPENDENT_AMBULATORY_CARE_PROVIDER_SITE_OTHER): Payer: Self-pay | Admitting: Physician Assistant

## 2016-06-11 VITALS — BP 111/79 | HR 94 | Temp 97.9°F | Ht <= 58 in | Wt 121.6 lb

## 2016-06-11 DIAGNOSIS — M25561 Pain in right knee: Secondary | ICD-10-CM

## 2016-06-11 DIAGNOSIS — K0889 Other specified disorders of teeth and supporting structures: Secondary | ICD-10-CM

## 2016-06-11 DIAGNOSIS — R221 Localized swelling, mass and lump, neck: Secondary | ICD-10-CM

## 2016-06-11 DIAGNOSIS — M25562 Pain in left knee: Secondary | ICD-10-CM

## 2016-06-11 DIAGNOSIS — J029 Acute pharyngitis, unspecified: Secondary | ICD-10-CM | POA: Diagnosis not present

## 2016-06-11 LAB — POCT RAPID STREP A (OFFICE): RAPID STREP A SCREEN: NEGATIVE

## 2016-06-11 MED ORDER — MELOXICAM 7.5 MG PO TABS: 7.5000 mg | ORAL_TABLET | Freq: Every day | ORAL | 1 refills | Status: DC | PRN

## 2016-06-11 MED ORDER — TRAMADOL HCL 50 MG PO TABS: 50.0000 mg | ORAL_TABLET | Freq: Three times a day (TID) | ORAL | 0 refills | Status: AC | PRN

## 2016-06-11 NOTE — Patient Instructions (Signed)
Dental Pain Dental pain may be caused by many things, including:  Tooth decay (cavities or caries). Cavities expose the nerve of your tooth to air and hot or cold temperatures. This can cause pain or discomfort.  Abscess or infection. A dental abscess is a collection of infected pus from a bacterial infection in the inner part of the tooth (pulp). It usually occurs at the end of the tooth's root.  Injury.  An unknown reason (idiopathic). Your pain may be mild or severe. It may only occur when:  You are chewing.  You are exposed to hot or cold temperature.  You are eating or drinking sugary foods or beverages, such as soda or candy. Your pain may also be constant. Follow these instructions at home: Watch your dental pain for any changes. The following actions may help to lessen any discomfort that you are feeling:  Take medicines only as directed by your dentist.  If you were prescribed an antibiotic medicine, finish all of it even if you start to feel better.  Keep all follow-up visits as directed by your dentist. This is important.  Do not apply heat to the outside of your face.  Rinse your mouth or gargle with salt water if directed by your dentist. This helps with pain and swelling.  You can make salt water by adding  tsp of salt to 1 cup of warm water.  Apply ice to the painful area of your face:  Put ice in a plastic bag.  Place a towel between your skin and the bag.  Leave the ice on for 20 minutes, 2-3 times per day.  Avoid foods or drinks that cause you pain, such as:  Very hot or very cold foods or drinks.  Sweet or sugary foods or drinks. Contact a health care provider if:  Your pain is not controlled with medicines.  Your symptoms are worse.  You have new symptoms. Get help right away if:  You are unable to open your mouth.  You are having trouble breathing or swallowing.  You have a fever.  Your face, neck, or jaw is swollen. This information  is not intended to replace advice given to you by your health care provider. Make sure you discuss any questions you have with your health care provider. Document Released: 03/25/2005 Document Revised: 08/03/2015 Document Reviewed: 03/21/2014 Elsevier Interactive Patient Education  2017 [employer]   

## 2016-06-11 NOTE — Progress Notes (Signed)
Subjective:  Patient ID: Rebecca Knapp, female    DOB: 05/19/1960  Age: 56 y.o. MRN: [id number]  CC:  Dental pain, neck mass  HPI Rebecca Knapp is a 56 y.o. female with a PMH of Osteoarthritis presents with severe tooth pain at tooth number 19 and 30. Has not been able to establish dental care. Also has a right-sided neck mass present for any months. Mass nontender. Progression unknown. No constitutional symptoms. Lastly, she reports generalized knee pain bilaterally. Maintains flexibility and ROM but painful to stand/walk for prolonged periods. Denies chest pain, shortness of breath, headache, abdominal pain, fever, chills, nausea, vomiting, rash, GI/GU symptoms.    Outpatient Medications Prior to Visit  Medication Sig Dispense Refill  . oxyCODONE-acetaminophen (ROXICET) 5-325 MG tablet Take 1-2 tablets by mouth every 6 (six) hours as needed for severe pain. 30 tablet 0  . naproxen (NAPROSYN) 500 MG tablet Take 1 tablet (500 mg total) by mouth 2 (two) times daily as needed for moderate pain (take with food). (Patient not taking: Reported on 06/11/2016) 60 tablet 0   No facility-administered medications prior to visit.      ROS Review of Systems  Constitutional: Negative for chills, fever and malaise/fatigue.  HENT:       Toothache  Eyes: Negative for blurred vision.  Respiratory: Negative for shortness of breath.   Cardiovascular: Negative for chest pain and palpitations.  Gastrointestinal: Negative for abdominal pain and nausea.  Genitourinary: Negative for dysuria and hematuria.  Musculoskeletal: Negative for joint pain and myalgias.  Skin: Negative for rash.  Neurological: Negative for tingling and headaches.  Psychiatric/Behavioral: Negative for depression. The patient is not nervous/anxious.     Objective:  BP 111/79 (BP Location: Right Arm, Patient Position: Sitting, Cuff Size: Normal)   Pulse 94   Temp 97.9 F (36.6 C) (Oral)   Ht 4\' 9"  (1.448 m)   Wt 121 lb 9.6 oz (55.2  kg)   SpO2 98%   BMI 26.31 kg/m   BP/Weight 06/11/2016 05/22/2015 03/27/2015  Systolic BP 111 122 110  Diastolic BP 79 88 60  Wt. (Lbs) 121.6 110 -  BMI 26.31 23 -      Physical Exam  Constitutional: She is oriented to person, place, and time.  Appears older than stated age, NAD, polite  HENT:  Head: Normocephalic and atraumatic.  Mouth/Throat: No oropharyngeal exudate.  Moderately large, freely movable neck mass in proximity to right lobe of thyroid, possible thyroid involvement. Mass is somewhat fluctuant, no increased warmth, no overlying erythema, no suppuration. TTP at tooth #19 and #30. Generally poor dentition with periodontitis. No erythema/edema, lying and no suppuration/bleeding noted.  Eyes: No scleral icterus.  Neck: Normal range of motion. Neck supple. No thyromegaly present.  Cardiovascular: Normal rate, regular rhythm and normal heart sounds.   Pulmonary/Chest: Effort normal and breath sounds normal.  Abdominal: Soft. Bowel sounds are normal. There is no tenderness.  Musculoskeletal: She exhibits no edema.  Knee with full active range of motion bilaterally. No tenderness to palpation, no crepitation, no apparent effusion, no erythema, no ecchymosis, no patellar tenderness/deviation.  Neurological: She is alert and oriented to person, place, and time.  Skin: Skin is warm and dry. No rash noted. No erythema. No pallor.  Psychiatric: She has a normal mood and affect. Her behavior is normal. Thought content normal.  Vitals reviewed.    Assessment & Plan:   1. Toothache - Ambulatory referral to Dentistry - meloxicam (MOBIC) 7.5 MG tablet; Take 1  tablet (7.5 mg total) by mouth daily as needed for pain.  Dispense: 15 tablet; Refill: 1  2. Neck mass - Suspected thyroid mass vs soft tissue mass - US Soft Tissue Head/Neck; Future - Thyroid Panel With TSH - CBC with Differential/Platelet - Comprehensive metabolic panel  3. Pain in both knees, unspecified chronicity -  likely osteoarthritis.  - meloxicam (MOBIC) 7.5 MG tablet; Take 1 tablet (7.5 mg total) by mouth daily as needed for pain.  Dispense: 15 tablet; Refill: 1 - Tramadol 50mg  q8hrs PRN  4. Sore throat - Rapid Strep A negative - CMA ran under standing order.  Meds ordered this encounter  Medications  . meloxicam (MOBIC) 7.5 MG tablet    Sig: Take 1 tablet (7.5 mg total) by mouth daily as needed for pain.    Dispense:  15 tablet    Refill:  1    Order Specific Question:   Supervising Provider    Answer:   Quentin AngstJEGEDE, OLUGBEMIGA E [id number]]  . traMADol (ULTRAM) 50 MG tablet    Sig: Take 1 tablet (50 mg total) by mouth every 8 (eight) hours as needed.    Dispense:  15 tablet    Refill:  0    Order Specific Question:   Supervising Provider    Answer:   Quentin AngstJEGEDE, OLUGBEMIGA E [id number]]    Follow-up: Return in about 4 weeks (around 07/09/2016) for f/u neck mass and toothache.   Loletta Specteroger David Gomez PA

## 2016-06-12 LAB — CBC WITH DIFFERENTIAL/PLATELET
BASOS ABS: 0.1 10*3/uL (ref 0.0–0.2)
Basos: 1 %
EOS (ABSOLUTE): 0.6 10*3/uL — ABNORMAL HIGH (ref 0.0–0.4)
Eos: 9 %
Hematocrit: 36.1 % (ref 34.0–46.6)
Hemoglobin: 11.3 g/dL (ref 11.1–15.9)
IMMATURE GRANULOCYTES: 0 %
Immature Grans (Abs): 0 10*3/uL (ref 0.0–0.1)
LYMPHS ABS: 2.9 10*3/uL (ref 0.7–3.1)
Lymphs: 39 %
MCH: 23.1 pg — ABNORMAL LOW (ref 26.6–33.0)
MCHC: 31.3 g/dL — AB (ref 31.5–35.7)
MCV: 74 fL — ABNORMAL LOW (ref 79–97)
MONOCYTES: 9 %
MONOS ABS: 0.7 10*3/uL (ref 0.1–0.9)
NEUTROS PCT: 42 %
Neutrophils Absolute: 3.1 10*3/uL (ref 1.4–7.0)
Platelets: 289 10*3/uL (ref 150–379)
RBC: 4.9 x10E6/uL (ref 3.77–5.28)
RDW: 15.5 % — AB (ref 12.3–15.4)
WBC: 7.3 10*3/uL (ref 3.4–10.8)

## 2016-06-12 LAB — COMPREHENSIVE METABOLIC PANEL
ALK PHOS: 100 IU/L (ref 39–117)
ALT: 41 IU/L — AB (ref 0–32)
AST: 27 IU/L (ref 0–40)
Albumin/Globulin Ratio: 1.5 (ref 1.2–2.2)
Albumin: 4.6 g/dL (ref 3.5–5.5)
BUN/Creatinine Ratio: 22 (ref 9–23)
BUN: 21 mg/dL (ref 6–24)
Bilirubin Total: 0.3 mg/dL (ref 0.0–1.2)
CALCIUM: 9.5 mg/dL (ref 8.7–10.2)
CO2: 22 mmol/L (ref 18–29)
CREATININE: 0.96 mg/dL (ref 0.57–1.00)
Chloride: 102 mmol/L (ref 96–106)
GFR calc Af Amer: 77 mL/min/{1.73_m2} (ref >59)
GFR, EST NON AFRICAN AMERICAN: 67 mL/min/{1.73_m2} (ref >59)
GLUCOSE: 124 mg/dL — AB (ref 65–99)
Globulin, Total: 3.1 g/dL (ref 1.5–4.5)
Potassium: 4 mmol/L (ref 3.5–5.2)
Sodium: 141 mmol/L (ref 134–144)
Total Protein: 7.7 g/dL (ref 6.0–8.5)

## 2016-06-12 LAB — THYROID PANEL WITH TSH
FREE THYROXINE INDEX: 2 (ref 1.2–4.9)
T3 Uptake Ratio: 27 % (ref 24–39)
T4, Total: 7.3 ug/dL (ref 4.5–12.0)
TSH: 1.31 u[IU]/mL (ref 0.450–4.500)

## 2016-06-17 ENCOUNTER — Ambulatory Visit (HOSPITAL_COMMUNITY): Payer: 59

## 2016-06-21 ENCOUNTER — Ambulatory Visit (HOSPITAL_COMMUNITY): Payer: 59

## 2016-06-24 ENCOUNTER — Ambulatory Visit (HOSPITAL_COMMUNITY)
Admission: RE | Admit: 2016-06-24 | Discharge: 2016-06-24 | Disposition: A | Discharge status: Home / Self Care | Payer: 59 | Source: Ambulatory Visit | Attending: Physician Assistant | Admitting: Physician Assistant

## 2016-06-24 DIAGNOSIS — R221 Localized swelling, mass and lump, neck: Secondary | ICD-10-CM

## 2016-06-24 DIAGNOSIS — E042 Nontoxic multinodular goiter: Secondary | ICD-10-CM | POA: Diagnosis not present

## 2016-06-25 ENCOUNTER — Other Ambulatory Visit (INDEPENDENT_AMBULATORY_CARE_PROVIDER_SITE_OTHER): Payer: Self-pay | Admitting: Physician Assistant

## 2016-06-25 DIAGNOSIS — E042 Nontoxic multinodular goiter: Secondary | ICD-10-CM

## 2016-06-25 NOTE — Progress Notes (Signed)
Multinodular goiter

## 2016-07-04 ENCOUNTER — Encounter: Payer: Self-pay | Admitting: Endocrinology

## 2016-07-31 ENCOUNTER — Telehealth (INDEPENDENT_AMBULATORY_CARE_PROVIDER_SITE_OTHER): Payer: Self-pay | Admitting: Physician Assistant

## 2016-07-31 NOTE — Telephone Encounter (Signed)
Patient's friend Salvadore Dom called stated would like to get [place] results for patient.  Please call Mrs Salvadore Dom Ph: [phone]

## 2016-08-01 NOTE — Telephone Encounter (Signed)
Gave information to case Production designer, theatre/television/film. Maryjean Morn, CMA

## 2016-08-19 ENCOUNTER — Ambulatory Visit: Payer: 59 | Admitting: Endocrinology

## 2016-09-13 ENCOUNTER — Ambulatory Visit (INDEPENDENT_AMBULATORY_CARE_PROVIDER_SITE_OTHER): Payer: 59 | Admitting: Endocrinology

## 2016-09-13 ENCOUNTER — Encounter: Payer: Self-pay | Admitting: Endocrinology

## 2016-09-13 ENCOUNTER — Other Ambulatory Visit (HOSPITAL_COMMUNITY)
Admission: RE | Admit: 2016-09-13 | Discharge: 2016-09-13 | Disposition: A | Discharge status: Home / Self Care | Payer: 59 | Source: Ambulatory Visit | Attending: Endocrinology | Admitting: Endocrinology

## 2016-09-13 VITALS — BP 120/82 | HR 88 | Ht <= 58 in | Wt 113.0 lb

## 2016-09-13 DIAGNOSIS — E042 Nontoxic multinodular goiter: Secondary | ICD-10-CM | POA: Insufficient documentation

## 2016-09-13 NOTE — Patient Instructions (Addendum)
We'll let you know about the biopsy results. If as expected, no cancer is found, please come back for a follow-up appointment in 6 months most of the time, a "lumpy thyroid" will eventually become overactive.  this is usually a slow process, happening over the span of many years. 
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
## 2017-03-20 ENCOUNTER — Ambulatory Visit: Payer: 59 | Admitting: Endocrinology

## 2018-11-11 ENCOUNTER — Other Ambulatory Visit: Payer: Self-pay | Admitting: Family Medicine

## 2018-11-11 DIAGNOSIS — Z20822 Contact with and (suspected) exposure to covid-19: Secondary | ICD-10-CM

## 2018-11-16 ENCOUNTER — Other Ambulatory Visit: Payer: Self-pay

## 2018-11-16 DIAGNOSIS — Z20822 Contact with and (suspected) exposure to covid-19: Secondary | ICD-10-CM

## 2018-11-17 LAB — NOVEL CORONAVIRUS, NAA: SARS-CoV-2, NAA: NOT DETECTED

## 2019-10-14 ENCOUNTER — Ambulatory Visit: Payer: 59 | Attending: Internal Medicine

## 2019-10-14 DIAGNOSIS — Z23 Encounter for immunization: Secondary | ICD-10-CM

## 2019-10-14 NOTE — Progress Notes (Signed)
   Covid-19 Vaccination Clinic  Name:  Rebecca Knapp    MRN: [id number] DOB: 12-20-1960  10/14/2019  [name] was observed post Covid-19 immunization for 15 minutes without incident. She was provided with Vaccine Information Sheet and instruction to access the V-Safe system.   [name] was instructed to call 911 with any severe reactions post vaccine: Marland Kitchen Difficulty breathing  . Swelling of face and throat  . A fast heartbeat  . A bad rash all over body  . Dizziness and weakness   Immunizations Administered    Name Date Dose VIS Date Route   Pfizer COVID-19 Vaccine 10/14/2019 10:50 AM 0.3 mL 06/02/2018 Intramuscular   Manufacturer: [employer], [employer]   Lot: [id number]   NDC: [address]-9

## 2019-11-06 ENCOUNTER — Ambulatory Visit: Payer: Self-pay | Attending: Internal Medicine

## 2019-11-06 ENCOUNTER — Other Ambulatory Visit: Payer: Self-pay

## 2019-11-06 DIAGNOSIS — Z23 Encounter for immunization: Secondary | ICD-10-CM

## 2019-11-06 NOTE — Progress Notes (Signed)
   Covid-19 Vaccination Clinic  Name:  OKLA QAZI    MRN: [id number] DOB: [birth year]  11/06/2019  Ms. Gu was observed post Covid-19 immunization for 15 minutes without incident. She was provided with Vaccine Information Sheet and instruction to access the V-Safe system.   Ms. Manrique was instructed to call 911 with any severe reactions post vaccine: Marland Kitchen Difficulty breathing  . Swelling of face and throat  . A fast heartbeat  . A bad rash all over body  . Dizziness and weakness   Immunizations Administered    Name Date Dose VIS Date Route   Pfizer COVID-19 Vaccine 11/06/2019  9:19 AM 0.3 mL 06/02/2018 Intramuscular   Manufacturer: [employer], [employer]   Lot: [id number]   NDC: [address]-6

## 2020-05-08 ENCOUNTER — Telehealth: Payer: Self-pay

## 2020-05-08 NOTE — Telephone Encounter (Signed)
Call placed to patient via the phone number in the appt notes. Reached a friend and left a generic message with her that the patients upcoming appointment had been cancelled and needed to be rescheduled. She said she would pass on the message to have patient call back and reschedule.   Patient appt for 05-30-20 cancelled due to provider out of office. Please reschedule.

## 2020-05-29 ENCOUNTER — Ambulatory Visit: Payer: Self-pay | Admitting: Nurse Practitioner

## 2020-05-30 ENCOUNTER — Ambulatory Visit: Payer: Self-pay | Admitting: Internal Medicine

## 2020-08-21 ENCOUNTER — Ambulatory Visit: Payer: 59 | Attending: Family Medicine | Admitting: Family Medicine

## 2020-08-21 ENCOUNTER — Other Ambulatory Visit: Payer: Self-pay

## 2020-08-21 VITALS — BP 99/64 | HR 98 | Ht <= 58 in | Wt 113.6 lb

## 2020-08-21 DIAGNOSIS — E042 Nontoxic multinodular goiter: Secondary | ICD-10-CM

## 2020-08-21 DIAGNOSIS — M170 Bilateral primary osteoarthritis of knee: Secondary | ICD-10-CM | POA: Diagnosis not present

## 2020-08-21 MED ORDER — MELOXICAM 7.5 MG PO TABS: 7.5000 mg | ORAL_TABLET | Freq: Every day | ORAL | 1 refills | Status: DC | PRN
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
## 2020-08-21 NOTE — Progress Notes (Signed)
Having pain in knees. 

## 2020-08-22 ENCOUNTER — Telehealth: Payer: Self-pay

## 2020-08-22 LAB — BASIC METABOLIC PANEL
BUN/Creatinine Ratio: 25 — ABNORMAL HIGH (ref 9–23)
BUN: 21 mg/dL (ref 6–24)
CO2: 18 mmol/L — ABNORMAL LOW (ref 20–29)
Calcium: 8.8 mg/dL (ref 8.7–10.2)
Chloride: 104 mmol/L (ref 96–106)
Creatinine, Ser: 0.83 mg/dL (ref 0.57–1.00)
Glucose: 69 mg/dL (ref 65–99)
Potassium: 3.8 mmol/L (ref 3.5–5.2)
Sodium: 137 mmol/L (ref 134–144)
eGFR: 81 mL/min/{1.73_m2} (ref >59)

## 2020-08-22 NOTE — Telephone Encounter (Signed)
Normal results letter will be mailed to patient. 

## 2020-08-22 NOTE — Telephone Encounter (Signed)
-----   Message from Hoy Register, MD sent at 08/22/2020 10:18 AM EDT ----- Please inform the patient that labs are normal. Thank you.

## 2020-11-21 ENCOUNTER — Encounter: Payer: 59 | Admitting: Family Medicine

## 2020-11-29 ENCOUNTER — Other Ambulatory Visit: Payer: Self-pay

## 2020-11-29 ENCOUNTER — Ambulatory Visit (INDEPENDENT_AMBULATORY_CARE_PROVIDER_SITE_OTHER): Payer: 59 | Admitting: Endocrinology

## 2020-11-29 VITALS — BP 130/84 | HR 79 | Ht <= 58 in | Wt 117.4 lb

## 2020-11-29 DIAGNOSIS — E042 Nontoxic multinodular goiter: Secondary | ICD-10-CM | POA: Diagnosis not present

## 2020-11-29 LAB — TSH: TSH: 2.23 u[IU]/mL (ref 0.35–5.50)

## 2020-11-29 LAB — T4, FREE: Free T4: 0.81 ng/dL (ref 0.60–1.60)
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
## 2020-11-29 NOTE — Patient Instructions (Addendum)
Blood tests are requested for you today.  We'll let you know about the results.   Let's recheck the ultrasound.  you will receive a phone call, about a day and time for an appointment.   If these tests are unchanged, please come back for a follow-up appointment in 1 year.     Hm nay chng ti yu c?u xt nghi?m mu cho b?n. Chng ti s? cho b?n bi?t v? k?t qu?Marland Kitchen Hy siu m ki?m tra l?i. b?n s? nh?n ???c m?t cu?c ?i?n tho?i, kho?ng m?t ngy v th?i gian cho m?t cu?c h?n. N?u cc xt nghi?m ny khng thay ??i, vui lng ??n h?n ti khm sau 1 n?m.

## 2020-12-06 ENCOUNTER — Other Ambulatory Visit: Payer: Self-pay

## 2020-12-06 ENCOUNTER — Ambulatory Visit
Admission: RE | Admit: 2020-12-06 | Discharge: 2020-12-06 | Disposition: A | Discharge status: Home / Self Care | Payer: 59 | Source: Ambulatory Visit | Attending: Endocrinology | Admitting: Endocrinology

## 2020-12-06 DIAGNOSIS — E042 Nontoxic multinodular goiter: Secondary | ICD-10-CM

## 2020-12-13 ENCOUNTER — Telehealth: Payer: Self-pay

## 2020-12-13 ENCOUNTER — Other Ambulatory Visit: Payer: Self-pay | Admitting: Endocrinology

## 2020-12-13 DIAGNOSIS — E041 Nontoxic single thyroid nodule: Secondary | ICD-10-CM

## 2020-12-13 NOTE — Telephone Encounter (Signed)
Spoke with Linn and she stated that it would be ok to go ahead and place the referral for pt.

## 2021-01-08 ENCOUNTER — Other Ambulatory Visit (HOSPITAL_COMMUNITY)
Admission: RE | Admit: 2021-01-08 | Discharge: 2021-01-08 | Disposition: A | Discharge status: Home / Self Care | Payer: 59 | Source: Ambulatory Visit | Attending: Family Medicine | Admitting: Family Medicine

## 2021-01-08 ENCOUNTER — Ambulatory Visit: Payer: 59 | Attending: Family Medicine | Admitting: Family Medicine

## 2021-01-08 ENCOUNTER — Other Ambulatory Visit: Payer: Self-pay

## 2021-01-08 VITALS — BP 104/78 | HR 88 | Ht <= 58 in | Wt 114.8 lb

## 2021-01-08 DIAGNOSIS — R6889 Other general symptoms and signs: Secondary | ICD-10-CM | POA: Insufficient documentation

## 2021-01-08 DIAGNOSIS — Z1159 Encounter for screening for other viral diseases: Secondary | ICD-10-CM

## 2021-01-08 DIAGNOSIS — M170 Bilateral primary osteoarthritis of knee: Secondary | ICD-10-CM

## 2021-01-08 DIAGNOSIS — Z1211 Encounter for screening for malignant neoplasm of colon: Secondary | ICD-10-CM

## 2021-01-08 DIAGNOSIS — Z0000 Encounter for general adult medical examination without abnormal findings: Secondary | ICD-10-CM

## 2021-01-08 DIAGNOSIS — Z124 Encounter for screening for malignant neoplasm of cervix: Secondary | ICD-10-CM | POA: Diagnosis not present

## 2021-01-08 DIAGNOSIS — Z23 Encounter for immunization: Secondary | ICD-10-CM | POA: Diagnosis not present

## 2021-01-08 DIAGNOSIS — Z0001 Encounter for general adult medical examination with abnormal findings: Secondary | ICD-10-CM

## 2021-01-08 DIAGNOSIS — N810 Urethrocele: Secondary | ICD-10-CM | POA: Diagnosis not present

## 2021-01-08 DIAGNOSIS — Z1231 Encounter for screening mammogram for malignant neoplasm of breast: Secondary | ICD-10-CM

## 2021-01-08 MED ORDER — MELOXICAM 7.5 MG PO TABS: 7.5000 mg | ORAL_TABLET | Freq: Every day | ORAL | 1 refills | Status: AC | PRN

## 2021-01-08 NOTE — Patient Instructions (Signed)
Health Maintenance, Female Adopting a healthy lifestyle and getting preventive care are important in promoting health and wellness. Ask your health care provider about: The right schedule for you to have regular tests and exams. Things you can do on your own to prevent diseases and keep yourself healthy. What should I know about diet, weight, and exercise? Eat a healthy diet  Eat a diet that includes plenty of vegetables, fruits, low-fat dairy products, and lean protein. Do not eat a lot of foods that are high in solid fats, added sugars, or sodium. Maintain a healthy weight Body mass index (BMI) is used to identify weight problems. It estimates body fat based on height and weight. Your health care provider can help determine your BMI and help you achieve or maintain a healthy weight. Get regular exercise Get regular exercise. This is one of the most important things you can do for your health. Most adults should: Exercise for at least 150 minutes each week. The exercise should increase your heart rate and make you sweat (moderate-intensity exercise). Do strengthening exercises at least twice a week. This is in addition to the moderate-intensity exercise. Spend less time sitting. Even light physical activity can be beneficial. Watch cholesterol and blood lipids Have your blood tested for lipids and cholesterol at 59 years of age, then have this test every 5 years. Have your cholesterol levels checked more often if: Your lipid or cholesterol levels are high. You are older than 59 years of age. You are at high risk for heart disease. What should I know about cancer screening? Depending on your health history and family history, you may need to have cancer screening at various ages. This may include screening for: Breast cancer. Cervical cancer. Colorectal cancer. Skin cancer. Lung cancer. What should I know about heart disease, diabetes, and high blood pressure? Blood pressure and heart  disease High blood pressure causes heart disease and increases the risk of stroke. This is more likely to develop in people who have high blood pressure readings, are of African descent, or are overweight. Have your blood pressure checked: Every 3-5 years if you are 18-39 years of age. Every year if you are 40 years old or older. Diabetes Have regular diabetes screenings. This checks your fasting blood sugar level. Have the screening done: Once every three years after age 40 if you are at a normal weight and have a low risk for diabetes. More often and at a younger age if you are overweight or have a high risk for diabetes. What should I know about preventing infection? Hepatitis B If you have a higher risk for hepatitis B, you should be screened for this virus. Talk with your health care provider to find out if you are at risk for hepatitis B infection. Hepatitis C Testing is recommended for: Everyone born from 1945 through 1965. Anyone with known risk factors for hepatitis C. Sexually transmitted infections (STIs) Get screened for STIs, including gonorrhea and chlamydia, if: You are sexually active and are younger than 59 years of age. You are older than 59 years of age and your health care provider tells you that you are at risk for this type of infection. Your sexual activity has changed since you were last screened, and you are at increased risk for chlamydia or gonorrhea. Ask your health care provider if you are at risk. Ask your health care provider about whether you are at high risk for HIV. Your health care provider may recommend a prescription medicine   to help prevent HIV infection. If you choose to take medicine to prevent HIV, you should first get tested for HIV. You should then be tested every 3 months for as long as you are taking the medicine. Pregnancy If you are about to stop having your period (premenopausal) and you may become pregnant, seek counseling before you get  pregnant. Take 400 to 800 micrograms (mcg) of folic acid every day if you become pregnant. Ask for birth control (contraception) if you want to prevent pregnancy. Osteoporosis and menopause Osteoporosis is a disease in which the bones lose minerals and strength with aging. This can result in bone fractures. If you are 65 years old or older, or if you are at risk for osteoporosis and fractures, ask your health care provider if you should: Be screened for bone loss. Take a calcium or vitamin D supplement to lower your risk of fractures. Be given hormone replacement therapy (HRT) to treat symptoms of menopause. Follow these instructions at home: Lifestyle Do not use any products that contain nicotine or tobacco, such as cigarettes, e-cigarettes, and chewing tobacco. If you need help quitting, ask your health care provider. Do not use street drugs. Do not share needles. Ask your health care provider for help if you need support or information about quitting drugs. Alcohol use Do not drink alcohol if: Your health care provider tells you not to drink. You are pregnant, may be pregnant, or are planning to become pregnant. If you drink alcohol: Limit how much you use to 0-1 drink a day. Limit intake if you are breastfeeding. Be aware of how much alcohol is in your drink. In the [place] one drink equals one 12 oz bottle of beer (355 mL), one 5 oz glass of wine (148 mL), or one 1 oz glass of hard liquor (44 mL). General instructions Schedule regular health, dental, and eye exams. Stay current with your vaccines. Tell your health care provider if: You often feel depressed. You have ever been abused or do not feel safe at home. Summary Adopting a healthy lifestyle and getting preventive care are important in promoting health and wellness. Follow your health care provider's instructions about healthy diet, exercising, and getting tested or screened for diseases. Follow your health care provider's  instructions on monitoring your cholesterol and blood pressure. This information is not intended to replace advice given to you by your health care provider. Make sure you discuss any questions you have with your health care provider. Document Revised: 06/02/2020 Document Reviewed: 03/18/2018 Elsevier Patient Education  2022 Elsevier Inc.  

## 2021-01-08 NOTE — Progress Notes (Signed)
PHYSICAL/PAP 

## 2021-01-08 NOTE — Telephone Encounter (Signed)
Linn (Patient's Health Advocate) called again and requests to be called at ph# [phone] re: Linn has not heard from anyone regarding date and time for thyroid surgery consult
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
## 2021-01-09 LAB — CBC WITH DIFFERENTIAL/PLATELET
Basophils Absolute: 0 10*3/uL (ref 0.0–0.2)
Basos: 1 %
EOS (ABSOLUTE): 0.4 10*3/uL (ref 0.0–0.4)
Eos: 6 %
Hematocrit: 39.5 % (ref 34.0–46.6)
Hemoglobin: 12 g/dL (ref 11.1–15.9)
Immature Grans (Abs): 0 10*3/uL (ref 0.0–0.1)
Immature Granulocytes: 1 %
Lymphocytes Absolute: 2.4 10*3/uL (ref 0.7–3.1)
Lymphs: 34 %
MCH: 23.4 pg — ABNORMAL LOW (ref 26.6–33.0)
MCHC: 30.4 g/dL — ABNORMAL LOW (ref 31.5–35.7)
MCV: 77 fL — ABNORMAL LOW (ref 79–97)
Monocytes Absolute: 0.7 10*3/uL (ref 0.1–0.9)
Monocytes: 10 %
Neutrophils Absolute: 3.5 10*3/uL (ref 1.4–7.0)
Neutrophils: 48 %
Platelets: 246 10*3/uL (ref 150–450)
RBC: 5.12 x10E6/uL (ref 3.77–5.28)
RDW: 16 % — ABNORMAL HIGH (ref 11.7–15.4)
WBC: 7.1 10*3/uL (ref 3.4–10.8)

## 2021-01-09 LAB — BASIC METABOLIC PANEL
BUN/Creatinine Ratio: 24 — ABNORMAL HIGH (ref 9–23)
BUN: 16 mg/dL (ref 6–24)
CO2: 21 mmol/L (ref 20–29)
Calcium: 9.5 mg/dL (ref 8.7–10.2)
Chloride: 103 mmol/L (ref 96–106)
Creatinine, Ser: 0.67 mg/dL (ref 0.57–1.00)
Glucose: 74 mg/dL (ref 70–99)
Potassium: 3.9 mmol/L (ref 3.5–5.2)
Sodium: 139 mmol/L (ref 134–144)
eGFR: 101 mL/min/{1.73_m2} (ref >59)

## 2021-01-09 LAB — HCV RNA QUANT RFLX ULTRA OR GENOTYP: HCV Quant Baseline: NOT DETECTED IU/mL

## 2021-01-09 LAB — HIV ANTIBODY (ROUTINE TESTING W REFLEX): HIV Screen 4th Generation wRfx: NONREACTIVE

## 2021-01-10 LAB — CYTOLOGY - PAP
Comment: NEGATIVE
Diagnosis: NEGATIVE
High risk HPV: NEGATIVE

## 2021-01-11 NOTE — Telephone Encounter (Signed)
Message sent thru MyChart 

## 2021-01-12 ENCOUNTER — Telehealth: Payer: Self-pay

## 2021-01-12 NOTE — Telephone Encounter (Signed)
Rebecca Knapp was informed of Patient's lab results.

## 2021-01-12 NOTE — Telephone Encounter (Signed)
-----   Message from Hoy Register, MD sent at 01/10/2021  3:07 PM EDT ----- Please inform her that her PAP smear is normal

## 2021-01-12 NOTE — Telephone Encounter (Signed)
-----   Message from Hoy Register, MD sent at 01/09/2021  5:29 PM EDT ----- Please inform the patient that labs are stable.

## 2021-01-12 NOTE — Telephone Encounter (Signed)
-----   Message from Enobong Newlin, MD sent at 01/10/2021  3:07 PM EDT ----- Please inform her that her PAP smear is normal 

## 2021-05-08 ENCOUNTER — Telehealth: Payer: Self-pay | Admitting: Endocrinology

## 2021-05-08 NOTE — Telephone Encounter (Signed)
Linn states Patient has been waiting for a surgery consult from a referral that Dr. Loanne Drilling sent, however, Patient has never received a call re: Surgery. Linn states that In December she was told she would receive a call from the Surgeon's office. Linn requests to be called at ph# [phone] re: the status of the surgeon consult.

## 2021-05-11 NOTE — Telephone Encounter (Signed)
Message sent thru MyChart to F/U with [facility] Surgery direct with questions.

## 2021-07-09 ENCOUNTER — Ambulatory Visit: Payer: 59 | Attending: Family Medicine | Admitting: Family Medicine

## 2021-07-09 ENCOUNTER — Encounter: Payer: Self-pay | Admitting: Physician Assistant

## 2021-07-09 VITALS — BP 125/84 | HR 76 | Ht <= 58 in | Wt 112.0 lb

## 2021-07-09 DIAGNOSIS — E042 Nontoxic multinodular goiter: Secondary | ICD-10-CM | POA: Diagnosis not present

## 2021-07-09 NOTE — Patient Instructions (Signed)
Colonoscopy, Adult A colonoscopy is a procedure to look at the entire large intestine. This procedure is done using a long, thin, flexible tube that has a camera on theend. You may have a colonoscopy: As a part of normal colorectal screening. If you have certain symptoms, such as: A low number of red blood cells in your blood (anemia). Diarrhea that does not go away. Pain in your abdomen. Blood in your stool. A colonoscopy can help screen for and diagnose medical problems, including: Tumors. Extra tissue that grows where mucus forms (polyps). Inflammation. Areas of bleeding. Tell your health care provider about: Any allergies you have. All medicines you are taking, including vitamins, herbs, eye drops, creams, and over-the-counter medicines. Any problems you or family members have had with anesthetic medicines. Any blood disorders you have. Any surgeries you have had. Any medical conditions you have. Any problems you have had with having bowel movements. Whether you are pregnant or may be pregnant. What are the risks? Generally, this is a safe procedure. However, problems may occur, including: Bleeding. Damage to your intestine. Allergic reactions to medicines given during the procedure. Infection. This is rare. What happens before the procedure? Eating and drinking restrictions Follow instructions from your health care provider about eating or drinking restrictions, which may include: A few days before the procedure: Follow a low-fiber diet. Avoid nuts, seeds, dried fruit, raw fruits, and vegetables. 1-3 days before the procedure: Eat only gelatin dessert or ice pops. Drink only clear liquids, such as water, clear juice, clear broth or bouillon, black coffee or tea, or clear soft drinks or sports drinks. Avoid liquids that contain red or purple dye. The day of the procedure: Do not eat solid foods. You may continue to drink clear liquids until up to 2 hours before the  procedure. Do not eat or drink anything starting 2 hours before the procedure, or within the time period that your health care provider recommends. Bowel prep If you were prescribed a bowel prep to take by mouth (orally) to clean out your colon: Take it as told by your health care provider. Starting the day before your procedure, you will need to drink a large amount of liquid medicine. The liquid will cause you to have many bowel movements of loose stool until your stool becomes almost clear or light green. If your skin or the opening between the buttocks (anus) gets irritated from diarrhea, you may relieve the irritation using: Wipes with medicine in them, such as adult wet wipes with aloe and vitamin E. A product to soothe skin, such as petroleum jelly. If you vomit while drinking the bowel prep: Take a break for up to 60 minutes. Begin the bowel prep again. Call your health care provider if you keep vomiting or you cannot take the bowel prep without vomiting. To clean out your colon, you may also be given: Laxative medicines. These help you have a bowel movement. Instructions for enema use. An enema is liquid medicine injected into your rectum. Medicines Ask your health care provider about: Changing or stopping your regular medicines or supplements. This is especially important if you are taking iron supplements, diabetes medicines, or blood thinners. Taking medicines such as aspirin and ibuprofen. These medicines can thin your blood. Do not take these medicines unless your health care provider tells you to take them. Taking over-the-counter medicines, vitamins, herbs, and supplements. General instructions Ask your health care provider what steps will be taken to help prevent infection. These may include washing   skin with a germ-killing soap. Plan to have someone take you home from the hospital or clinic. What happens during the procedure?  An IV will be inserted into one of your  veins. You may be given one or more of the following: A medicine to help you relax (sedative). A medicine to numb the area (local anesthetic). A medicine to make you fall asleep (general anesthetic). This is rarely needed. You will lie on your side with your knees bent. The tube will: Have oil or gel put on it (be lubricated). Be inserted into your anus. Be gently eased through all parts of your large intestine. Air will be sent into your colon to keep it open. This may cause some pressure or cramping. Images will be taken with the camera and will appear on a screen. A small tissue sample may be removed to be looked at under a microscope (biopsy). The tissue may be sent to a lab for testing if any signs of problems are found. If small polyps are found, they may be removed and checked for cancer cells. When the procedure is finished, the tube will be removed. The procedure may vary among health care providers and hospitals. What happens after the procedure? Your blood pressure, heart rate, breathing rate, and blood oxygen level will be monitored until you leave the hospital or clinic. You may have a small amount of blood in your stool. You may pass gas and have mild cramping or bloating in your abdomen. This is caused by the air that was used to open your colon during the exam. Do not drive for 24 hours after the procedure. It is up to you to get the results of your procedure. Ask your health care provider, or the department that is doing the procedure, when your results will be ready. Summary A colonoscopy is a procedure to look at the entire large intestine. Follow instructions from your health care provider about eating and drinking before the procedure. If you were prescribed an oral bowel prep to clean out your colon, take it as told by your health care provider. During the colonoscopy, a flexible tube with a camera on its end is inserted into the anus and then passed into the other  parts of the large intestine. This information is not intended to replace advice given to you by your health care provider. Make sure you discuss any questions you have with your healthcare provider. Document Revised: 10/16/2018 Document Reviewed: 10/16/2018 Elsevier Patient Education  2022 Elsevier Inc.  
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
## 2021-07-12 ENCOUNTER — Ambulatory Visit (INDEPENDENT_AMBULATORY_CARE_PROVIDER_SITE_OTHER): Payer: 59 | Admitting: Endocrinology

## 2021-07-12 ENCOUNTER — Encounter: Payer: Self-pay | Admitting: Endocrinology

## 2021-07-12 VITALS — BP 110/68 | Ht <= 58 in | Wt 112.0 lb

## 2021-07-12 DIAGNOSIS — E041 Nontoxic single thyroid nodule: Secondary | ICD-10-CM | POA: Diagnosis not present
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
## 2021-07-12 NOTE — Patient Instructions (Addendum)
Please see Dr Gerrit Friends as scheduled.   ?You should have a follow-up endocrinology appointment after you get out of the hospital.  If you declines the surgery, you should have a follow-up endocrinology appointment in 6 months.   ? ? ? ?

## 2021-09-13 ENCOUNTER — Other Ambulatory Visit: Payer: Self-pay | Admitting: Surgery

## 2021-09-13 DIAGNOSIS — E041 Nontoxic single thyroid nodule: Secondary | ICD-10-CM

## 2021-09-20 ENCOUNTER — Ambulatory Visit
Admission: RE | Admit: 2021-09-20 | Discharge: 2021-09-20 | Disposition: A | Discharge status: Home / Self Care | Payer: 59 | Source: Ambulatory Visit | Attending: Surgery | Admitting: Surgery

## 2021-09-20 ENCOUNTER — Other Ambulatory Visit (HOSPITAL_COMMUNITY)
Admission: RE | Admit: 2021-09-20 | Discharge: 2021-09-20 | Disposition: A | Discharge status: Home / Self Care | Payer: 59 | Source: Ambulatory Visit | Attending: Surgery | Admitting: Surgery

## 2021-09-20 DIAGNOSIS — E041 Nontoxic single thyroid nodule: Secondary | ICD-10-CM | POA: Insufficient documentation

## 2021-09-21 LAB — CYTOLOGY - NON PAP

## 2021-09-23 NOTE — Progress Notes (Signed)
Good news!  Thyroid biopsy is benign.  Do not recommend surgery at this time.  Recommend follow up visit with me in one year with repeat USN and TSH level.  Tresa Endo - please arrange follow up.  tmg  Darnell Level, MD [facility] practice Office: [phone]

## 2021-11-29 ENCOUNTER — Ambulatory Visit: Payer: 59 | Admitting: Endocrinology

## 2022-07-09 ENCOUNTER — Encounter: Payer: 59 | Admitting: Family Medicine

## 2022-07-09 ENCOUNTER — Encounter (HOSPITAL_COMMUNITY): Payer: Self-pay

## 2022-07-09 ENCOUNTER — Ambulatory Visit (HOSPITAL_COMMUNITY)
Admission: EM | Admit: 2022-07-09 | Discharge: 2022-07-09 | Disposition: A | Discharge status: Home / Self Care | Payer: 59 | Attending: Family Medicine | Admitting: Family Medicine

## 2022-07-09 NOTE — ED Triage Notes (Signed)
Interpreter via telephone- Rebecca Knapp- Patient states she does not have an issue, but was told to come in for a follow up. Patient states she does not know why.

## 2022-07-09 NOTE — ED Notes (Signed)
Pt came in to urgent care today to be seen but should have been at the PCP office for a physical at 10:10am. We called the PCP office Dr. Margarita Rana to make them aware pt came to wrong office. Adivsed pt through translator service that pt is at wrong office and Dr. Benard Rink office will call her to reschedule. Pt verbalized understanding.   Translators number used was [employer] [phone]

## 2022-08-22 ENCOUNTER — Other Ambulatory Visit: Payer: Self-pay | Admitting: Surgery

## 2022-08-22 ENCOUNTER — Encounter: Payer: Self-pay | Admitting: Surgery

## 2022-08-22 DIAGNOSIS — E042 Nontoxic multinodular goiter: Secondary | ICD-10-CM

## 2022-09-20 ENCOUNTER — Other Ambulatory Visit: Payer: 59

## 2022-10-01 ENCOUNTER — Other Ambulatory Visit: Payer: 59

## 2022-10-11 ENCOUNTER — Ambulatory Visit
Admission: RE | Admit: 2022-10-11 | Discharge: 2022-10-11 | Disposition: A | Discharge status: Home / Self Care | Payer: 59 | Source: Ambulatory Visit | Attending: Surgery | Admitting: Surgery

## 2022-10-11 DIAGNOSIS — E042 Nontoxic multinodular goiter: Secondary | ICD-10-CM

## 2023-08-13 ENCOUNTER — Ambulatory Visit: Attending: Physician Assistant | Admitting: Physician Assistant
